# Patient Record
Sex: Female | Born: 1975
Health system: Southern US, Community
[De-identification: ages and names within clinical notes are randomized; demographics above are authoritative.]

## PROBLEM LIST (undated history)

## (undated) DIAGNOSIS — F32A Depression, unspecified: Secondary | ICD-10-CM

## (undated) DIAGNOSIS — E785 Hyperlipidemia, unspecified: Secondary | ICD-10-CM

## (undated) DIAGNOSIS — F419 Anxiety disorder, unspecified: Secondary | ICD-10-CM

## (undated) HISTORY — DX: Depression, unspecified: F32.A

## (undated) HISTORY — DX: Anxiety disorder, unspecified: F41.9

## (undated) HISTORY — DX: Hyperlipidemia, unspecified: E78.5

## (undated) HISTORY — PX: OTHER SURGICAL HISTORY: SHX169

---

## 2012-07-31 ENCOUNTER — Encounter (HOSPITAL_COMMUNITY): Payer: Self-pay | Admitting: *Deleted

## 2012-07-31 ENCOUNTER — Emergency Department (HOSPITAL_COMMUNITY): Payer: Medicaid Other

## 2012-07-31 ENCOUNTER — Emergency Department (HOSPITAL_COMMUNITY)
Admission: EM | Admit: 2012-07-31 | Discharge: 2012-08-01 | Disposition: A | Discharge status: Home / Self Care | Payer: Medicaid Other | Attending: Emergency Medicine | Admitting: Emergency Medicine

## 2012-07-31 DIAGNOSIS — M549 Dorsalgia, unspecified: Secondary | ICD-10-CM | POA: Insufficient documentation

## 2012-07-31 DIAGNOSIS — R109 Unspecified abdominal pain: Secondary | ICD-10-CM | POA: Insufficient documentation

## 2012-07-31 DIAGNOSIS — Z3202 Encounter for pregnancy test, result negative: Secondary | ICD-10-CM | POA: Insufficient documentation

## 2012-07-31 DIAGNOSIS — R112 Nausea with vomiting, unspecified: Secondary | ICD-10-CM | POA: Insufficient documentation

## 2012-07-31 DIAGNOSIS — R300 Dysuria: Secondary | ICD-10-CM | POA: Insufficient documentation

## 2012-07-31 DIAGNOSIS — R51 Headache: Secondary | ICD-10-CM | POA: Insufficient documentation

## 2012-07-31 DIAGNOSIS — N898 Other specified noninflammatory disorders of vagina: Secondary | ICD-10-CM | POA: Insufficient documentation

## 2012-07-31 LAB — CBC WITH DIFFERENTIAL/PLATELET
Eosinophils Absolute: 0.1 10*3/uL (ref 0.0–0.7)
Eosinophils Relative: 2 % (ref 0–5)
HCT: 38.7 % (ref 36.0–46.0)
Hemoglobin: 13.3 g/dL (ref 12.0–15.0)
Lymphs Abs: 3 10*3/uL (ref 0.7–4.0)
MCH: 30.5 pg (ref 26.0–34.0)
MCV: 88.8 fL (ref 78.0–100.0)
Monocytes Relative: 6 % (ref 3–12)
RBC: 4.36 MIL/uL (ref 3.87–5.11)

## 2012-07-31 LAB — URINALYSIS, ROUTINE W REFLEX MICROSCOPIC
Bilirubin Urine: NEGATIVE
Glucose, UA: NEGATIVE mg/dL
Ketones, ur: NEGATIVE mg/dL
pH: 5 (ref 5.0–8.0)

## 2012-07-31 LAB — WET PREP, GENITAL
Clue Cells Wet Prep HPF POC: NONE SEEN
Trich, Wet Prep: NONE SEEN

## 2012-07-31 LAB — URINE MICROSCOPIC-ADD ON

## 2012-07-31 LAB — POCT I-STAT, CHEM 8
Calcium, Ion: 1.19 mmol/L (ref 1.12–1.23)
Chloride: 106 mEq/L (ref 96–112)
Creatinine, Ser: 0.8 mg/dL (ref 0.50–1.10)
Glucose, Bld: 93 mg/dL (ref 70–99)
HCT: 39 % (ref 36.0–46.0)
Hemoglobin: 13.3 g/dL (ref 12.0–15.0)

## 2012-07-31 NOTE — ED Provider Notes (Signed)
History     CSN: [id number]  Arrival date & time 07/31/12  2032   First MD Initiated Contact with Patient 07/31/12 2154      Chief Complaint  Patient presents with  . Abdominal Pain  . Headache  . Back Pain    (Consider location/radiation/quality/duration/timing/severity/associated sxs/prior treatment) HPI Comments: Patient with 3 weeks of suprapubic pain, report has 2 days of vaginal spotting that resolved than 5 days ago return of vagnal bleeding. She has an IUD in place for the past 8 years and has had occasional irregular vaginal bleeding but none associated with pain.  She also report intermittent mid back pain with out dysuria, diarrhea or constipation.  Denies vaginal discharge has 1 sexual partner   Patient is a 37 y.o. female presenting with abdominal pain, headaches, and back pain. The history is provided by the patient.  Abdominal Pain Pain location:  Suprapubic Pain quality: stabbing   Pain radiates to:  Does not radiate Pain severity:  Moderate Onset quality:  Gradual Duration:  3 weeks Timing:  Constant Progression:  Waxing and waning Chronicity:  New Relieved by:  Nothing Ineffective treatments:  NSAIDs Associated symptoms: vaginal bleeding   Associated symptoms: no chills, no constipation, no diarrhea, no dysuria, no fever, no nausea, no vaginal discharge and no vomiting   Headache Associated symptoms: abdominal pain and back pain   Associated symptoms: no diarrhea, no dizziness, no fever, no nausea and no vomiting   Back Pain Associated symptoms: abdominal pain and headaches   Associated symptoms: no dysuria and no fever     No past medical history on file.  Past Surgical History  Procedure Laterality Date  . Ceserean      History reviewed. No pertinent family history.  History  Substance Use Topics  . Smoking status: Never Smoker   . Smokeless tobacco: Not on file  . Alcohol Use: No    OB History   Grav Para Term Preterm Abortions TAB SAB  Ect Mult Living                  Review of Systems  Constitutional: Negative for fever and chills.  Eyes: Negative for visual disturbance.  Gastrointestinal: Positive for abdominal pain. Negative for nausea, vomiting, diarrhea, constipation, blood in stool and abdominal distention.  Genitourinary: Positive for flank pain and vaginal bleeding. Negative for dysuria, frequency, decreased urine volume, vaginal discharge, vaginal pain and dyspareunia.  Musculoskeletal: Positive for back pain.  Skin: Negative for rash.  Neurological: Positive for headaches. Negative for dizziness.  All other systems reviewed and are negative.    Allergies  Review of patient's allergies indicates no known allergies.  Home Medications   Current Outpatient Rx  Name  Route  Sig  Dispense  Refill  . HYDROcodone-acetaminophen (NORCO/VICODIN) 5-325 MG per tablet   Oral   Take 1 tablet by mouth every 6 (six) hours as needed for pain.   10 tablet   0     BP 121/68  Pulse 74  Temp(Src) 98.7 F (37.1 C) (Oral)  Resp 20  SpO2 100%  LMP 07/27/2012  Physical Exam  Constitutional: She is oriented to person, place, and time. She appears well-developed and well-nourished.  HENT:  Head: Normocephalic.  Eyes: Pupils are equal, round, and reactive to light.  Neck: Normal range of motion.  Cardiovascular: Normal rate and regular rhythm.   Pulmonary/Chest: Effort normal and breath sounds normal.  Abdominal: Soft. Bowel sounds are normal. She exhibits no distension. There is  no hepatosplenomegaly. There is tenderness in the suprapubic area.  Genitourinary: Uterus normal. Uterus is not tender. Left adnexum displays tenderness. There is bleeding around the vagina. No tenderness around the vagina. No vaginal discharge found.  Musculoskeletal: Normal range of motion.  Lymphadenopathy:       Right: No inguinal adenopathy present.       Left: No inguinal adenopathy present.  Neurological: She is alert and oriented  to person, place, and time.  Skin: Skin is warm.    ED Course  Procedures (including critical care time)  Labs Reviewed  WET PREP, GENITAL - Abnormal; Notable for the following:    WBC, Wet Prep HPF POC FEW (*)    All other components within normal limits  URINALYSIS, ROUTINE W REFLEX MICROSCOPIC - Abnormal; Notable for the following:    APPearance CLOUDY (*)    Hgb urine dipstick LARGE (*)    Leukocytes, UA SMALL (*)    All other components within normal limits  GC/CHLAMYDIA PROBE AMP  PREGNANCY, URINE  CBC WITH DIFFERENTIAL  URINE MICROSCOPIC-ADD ON  POCT I-STAT, CHEM 8   US Transvaginal Non-ob  08/01/2012  *RADIOLOGY REPORT*  Clinical Data:  Abdominal pain.  Left lower quadrant pain. Evaluate for torsion.  TRANSABDOMINAL AND TRANSVAGINAL ULTRASOUND OF PELVIS DOPPLER ULTRASOUND OF OVARIES  Technique:  Both transabdominal and transvaginal ultrasound examinations of the pelvis were performed. Transabdominal technique was performed for global imaging of the pelvis including uterus, ovaries, adnexal regions, and pelvic cul-de-sac.  It was necessary to proceed with endovaginal exam following the transabdominal exam to visualize the endometrium and adnexal regions.  Color and duplex Doppler ultrasound was utilized to evaluate blood flow to the ovaries.  Comparison:  None  Findings:  Uterus:  7.6 x 4.2 x 5.2 cm.  Normal appearance.  Endometrium:  5.2 mm.  Intrauterine device is identified, in the expected location.  Right ovary: 2.8 x 1.5 x 1.6 cm.  Small follicles are present.  Left ovary:   3.0 x 2.0 x 2.4 cm.  Follicles are present.  Pulsed Doppler evaluation demonstrates normal arterial and venous flow.  Additional findings:  There is a small amount free pelvic fluid.  IMPRESSION:  1.  No evidence for adnexal mass. 2.  No evidence for ovarian torsion. 3.  Well-positioned intrauterine device.   Original Report Authenticated By: Norva Pavlov, M.D.    US Pelvis Complete  08/01/2012   *RADIOLOGY REPORT*  Clinical Data:  Abdominal pain.  Left lower quadrant pain. Evaluate for torsion.  TRANSABDOMINAL AND TRANSVAGINAL ULTRASOUND OF PELVIS DOPPLER ULTRASOUND OF OVARIES  Technique:  Both transabdominal and transvaginal ultrasound examinations of the pelvis were performed. Transabdominal technique was performed for global imaging of the pelvis including uterus, ovaries, adnexal regions, and pelvic cul-de-sac.  It was necessary to proceed with endovaginal exam following the transabdominal exam to visualize the endometrium and adnexal regions.  Color and duplex Doppler ultrasound was utilized to evaluate blood flow to the ovaries.  Comparison:  None  Findings:  Uterus:  7.6 x 4.2 x 5.2 cm.  Normal appearance.  Endometrium:  5.2 mm.  Intrauterine device is identified, in the expected location.  Right ovary: 2.8 x 1.5 x 1.6 cm.  Small follicles are present.  Left ovary:   3.0 x 2.0 x 2.4 cm.  Follicles are present.  Pulsed Doppler evaluation demonstrates normal arterial and venous flow.  Additional findings:  There is a small amount free pelvic fluid.  IMPRESSION:  1.  No evidence for  adnexal mass. 2.  No evidence for ovarian torsion. 3.  Well-positioned intrauterine device.   Original Report Authenticated By: Norva Pavlov, M.D.    [place] Art/ven Flow Abd Pelv Doppler  08/01/2012  *RADIOLOGY REPORT*  Clinical Data:  Abdominal pain.  Left lower quadrant pain. Evaluate for torsion.  TRANSABDOMINAL AND TRANSVAGINAL ULTRASOUND OF PELVIS DOPPLER ULTRASOUND OF OVARIES  Technique:  Both transabdominal and transvaginal ultrasound examinations of the pelvis were performed. Transabdominal technique was performed for global imaging of the pelvis including uterus, ovaries, adnexal regions, and pelvic cul-de-sac.  It was necessary to proceed with endovaginal exam following the transabdominal exam to visualize the endometrium and adnexal regions.  Color and duplex Doppler ultrasound was utilized to evaluate blood  flow to the ovaries.  Comparison:  None  Findings:  Uterus:  7.6 x 4.2 x 5.2 cm.  Normal appearance.  Endometrium:  5.2 mm.  Intrauterine device is identified, in the expected location.  Right ovary: 2.8 x 1.5 x 1.6 cm.  Small follicles are present.  Left ovary:   3.0 x 2.0 x 2.4 cm.  Follicles are present.  Pulsed Doppler evaluation demonstrates normal arterial and venous flow.  Additional findings:  There is a small amount free pelvic fluid.  IMPRESSION:  1.  No evidence for adnexal mass. 2.  No evidence for ovarian torsion. 3.  Well-positioned intrauterine device.   Original Report Authenticated By: Norva Pavlov, M.D.      1. Abdominal pain       MDM   We'll discharge home with prescription for pain control, and followup with her OB/GYN as needed        Arman Filter, NP 08/01/12 7829  Arman Filter, NP 08/01/12 5621  Arman Filter, NP 08/01/12 0222  Arman Filter, NP 08/01/12 0225

## 2012-07-31 NOTE — ED Notes (Signed)
Pt present with c/o mid abd pain and tenderness x 3 weeks.  Pt reports increased pain.  Pt has IUD placed.  Pt deneis n/v, chills or fever.  Pt denies abnormal discharge

## 2012-08-01 MED ORDER — HYDROCODONE-ACETAMINOPHEN 5-325 MG PO TABS: 1.0000 | ORAL_TABLET | Freq: Four times a day (QID) | ORAL | Status: DC | PRN

## 2012-08-01 NOTE — ED Provider Notes (Signed)
History     CSN: [id number]  Arrival date & time 07/31/12  2032   First MD Initiated Contact with Patient 07/31/12 2154      Chief Complaint  Patient presents with  . Abdominal Pain  . Headache  . Back Pain    (Consider location/radiation/quality/duration/timing/severity/associated sxs/prior treatment) HPI Comments: Nausea, abdominal pain headache  Patient is a 37 y.o. female presenting with abdominal pain, headaches, and back pain. The history is provided by the patient.  Abdominal Pain Pain location:  Suprapubic, RLQ and LLQ Pain quality: aching   Pain radiates to:  Back Pain severity:  Mild Onset quality:  Gradual Timing:  Constant Chronicity:  New Relieved by:  Nothing Worsened by:  Nothing tried Ineffective treatments:  None tried Associated symptoms: dysuria, nausea, vaginal bleeding and vomiting   Associated symptoms: no vaginal discharge   Headache Associated symptoms: abdominal pain, back pain, nausea and vomiting   Back Pain Associated symptoms: abdominal pain, dysuria and headaches     No past medical history on file.  Past Surgical History  Procedure Laterality Date  . Ceserean      History reviewed. No pertinent family history.  History  Substance Use Topics  . Smoking status: Never Smoker   . Smokeless tobacco: Not on file  . Alcohol Use: No    OB History   Grav Para Term Preterm Abortions TAB SAB Ect Mult Living                  Review of Systems  Gastrointestinal: Positive for nausea, vomiting and abdominal pain.  Genitourinary: Positive for dysuria and vaginal bleeding. Negative for vaginal discharge and vaginal pain.  Musculoskeletal: Positive for back pain.  Neurological: Positive for headaches.  All other systems reviewed and are negative.    Allergies  Review of patient's allergies indicates no known allergies.  Home Medications   Current Outpatient Rx  Name  Route  Sig  Dispense  Refill  . HYDROcodone-acetaminophen  (NORCO/VICODIN) 5-325 MG per tablet   Oral   Take 1 tablet by mouth every 6 (six) hours as needed for pain.   10 tablet   0     BP 117/78  Pulse 62  Temp(Src) 98.2 F (36.8 C) (Oral)  Resp 14  SpO2 100%  LMP 07/27/2012  Physical Exam  Nursing note and vitals reviewed. Constitutional: She appears well-developed and well-nourished.  HENT:  Head: Normocephalic.  Cardiovascular: Normal rate.   Abdominal: Soft. Bowel sounds are normal. She exhibits no distension.  Genitourinary: Vagina normal. Right adnexum displays no mass and no tenderness. Left adnexum displays no mass and no tenderness. No tenderness around the vagina. No vaginal discharge found.  Musculoskeletal: Normal range of motion.  Lymphadenopathy:       Right: No inguinal adenopathy present.  Neurological: She is alert.  Skin: Skin is warm.    ED Course  Procedures (including critical care time)  Labs Reviewed  WET PREP, GENITAL - Abnormal; Notable for the following:    WBC, Wet Prep HPF POC FEW (*)    All other components within normal limits  URINALYSIS, ROUTINE W REFLEX MICROSCOPIC - Abnormal; Notable for the following:    APPearance CLOUDY (*)    Hgb urine dipstick LARGE (*)    Leukocytes, UA SMALL (*)    All other components within normal limits  GC/CHLAMYDIA PROBE AMP  PREGNANCY, URINE  CBC WITH DIFFERENTIAL  URINE MICROSCOPIC-ADD ON  POCT I-STAT, CHEM 8   No results found.  1. Abdominal pain       MDM  Labs, UA and vaginal ultrasound normal will DC home with Hydrocodone Patient to return if not improving         Arman Filter, NP 08/03/12 2317  Arman Filter, NP 08/03/12 2317

## 2012-08-01 NOTE — ED Notes (Signed)
Pt verbalizes understanding 

## 2012-08-01 NOTE — ED Provider Notes (Signed)
Medical screening examination/treatment/procedure(s) were performed by non-physician practitioner and as supervising physician I was immediately available for consultation/collaboration.   David Yelverton, MD 08/01/12 2318 

## 2012-08-04 NOTE — ED Provider Notes (Signed)
Medical screening examination/treatment/procedure(s) were performed by non-physician practitioner and as supervising physician I was immediately available for consultation/collaboration.   Lyanne Co, MD 08/04/12 [phone]

## 2013-03-30 ENCOUNTER — Other Ambulatory Visit (HOSPITAL_COMMUNITY)
Admission: RE | Admit: 2013-03-30 | Discharge: 2013-03-30 | Disposition: A | Discharge status: Home / Self Care | Payer: 59 | Source: Ambulatory Visit | Attending: Obstetrics & Gynecology | Admitting: Obstetrics & Gynecology

## 2013-03-30 ENCOUNTER — Other Ambulatory Visit: Payer: Self-pay | Admitting: Obstetrics & Gynecology

## 2013-03-30 DIAGNOSIS — N760 Acute vaginitis: Secondary | ICD-10-CM | POA: Insufficient documentation

## 2013-03-30 DIAGNOSIS — Z01419 Encounter for gynecological examination (general) (routine) without abnormal findings: Secondary | ICD-10-CM | POA: Insufficient documentation

## 2013-03-30 DIAGNOSIS — Z1151 Encounter for screening for human papillomavirus (HPV): Secondary | ICD-10-CM | POA: Insufficient documentation

## 2015-09-07 ENCOUNTER — Encounter: Payer: Self-pay | Admitting: Family Medicine

## 2015-09-07 ENCOUNTER — Ambulatory Visit (INDEPENDENT_AMBULATORY_CARE_PROVIDER_SITE_OTHER): Payer: 59 | Admitting: Family Medicine

## 2015-09-07 VITALS — BP 130/80 | HR 84 | Temp 99.1°F | Resp 12 | Ht 65.75 in | Wt 147.1 lb

## 2015-09-07 DIAGNOSIS — R5382 Chronic fatigue, unspecified: Secondary | ICD-10-CM

## 2015-09-07 DIAGNOSIS — B351 Tinea unguium: Secondary | ICD-10-CM

## 2015-09-07 DIAGNOSIS — F331 Major depressive disorder, recurrent, moderate: Secondary | ICD-10-CM

## 2015-09-07 LAB — CBC
HCT: 43.8 % (ref 36.0–46.0)
Hemoglobin: 15 g/dL (ref 12.0–15.0)
MCHC: 34.2 g/dL (ref 30.0–36.0)
MCV: 91.3 fl (ref 78.0–100.0)
PLATELETS: 341 10*3/uL (ref 150.0–400.0)
RBC: 4.79 Mil/uL (ref 3.87–5.11)
RDW: 12.5 % (ref 11.5–15.5)
WBC: 10.2 10*3/uL (ref 4.0–10.5)

## 2015-09-07 LAB — TSH: TSH: 0.9 u[IU]/mL (ref 0.35–4.50)

## 2015-09-07 MED ORDER — EFINACONAZOLE 10 % EX SOLN: 1.0000 "application " | Freq: Every day | CUTANEOUS | Status: AC

## 2015-09-07 MED ORDER — SERTRALINE HCL 50 MG PO TABS
50.0000 mg | ORAL_TABLET | Freq: Every day | ORAL | Status: DC
Start: 2015-09-07 — End: 2015-12-29

## 2015-09-07 NOTE — Progress Notes (Addendum)
HPI:  Rebekah [name] is here to establish care with me. Former PCP, Dr Tally Joeavid Swayne. She follows with gynecologists annually for her female prevention care.  Hx of allergic rhinitis, well controlled with OTC Claritin 10 mg daily. She lives with husband and 2 children.  Last physical a few years ago.   Has the following chronic problems that require follow up and concerns today:  Today she is c/o fatigue, which she has had for many years. According to patient, she had labs done about 3 years ago and was instructed to follow every 2-3 years. She states that at take this daily worse in the morning. No associated fever, chills, arthralgias, myalgias, changes in bowel movements, skin rash, abnormal wt loss. She is not aware of any sleep apnea. Sleeps well, about 7-8 hours. She follows a healthy diet and exercises regularly.  Depression: She is reporting history of depression since she was in high school, history of suicidal attempt when she was 40 years old. Until about 1-2 years ago she was still having some suicidal thoughts, denies having a plan. She denies any hospitalization due to psychiatry illness. She has not tried any pharmacologic treatment for depression. Family history positive, father history of depression. She is not aware of any history of bipolar disease in her family. She states that sometimes she feels like eating "a lot" and sometimes she doesn't feel like eating, she doesn't have any problem  Sleeping.  Currently she is on oral contraceptive pills, started about 2 years ago, initially she felt like medication was aggravating her depression symptoms but started feeling better (back to her baseline) after a few months of taking.  "Nail fungal" infection: She is also complaining of fungal infection on all her goals for many years. She has tried several oral antimycotic medications, with partial improvement but worse again after a few months of finishing the treatment. She  denies any pain, periungual edema/erythema, or drainage.   REVIEW OF SYSTEMS:  General: Negative for fever, abnormal weight loss, changes in appetite.  + Fatigue.  Eyes: Negative for conjunctival erythema, visual changes.  ENT: Negative for earache, hearing loss, or ear drainage. + Intermittent rhinorrhea, nasal congestion. No sinus pain or epistaxis. Negative for oral lesions, odynophagia, dysphagia.  Neck: negative for swollen glands or masses.  Cardiac: Negative for chest pain, exertional dyspnea, irregular HR, claudication, cold extremities, or edema.  Respiratory: Negative for cough, dyspnea, wheezing, or hemoptysis.  Abdomen: Negative for abdominal pain, changes in bowel habits, blood in stool or melena, nausea, or vomiting.  GU: Negative for dysuria, urinary frequency, urinary ungency, gross hematuria, urine incontinence.   MS: No arthralgias, joint erythema or edema, joint stiffness, or limitation of ROM.  Neurologic: No confusion,  focal weakness, numbness or tingling, frequent/severe headaches, or tremor.  Psychiatric: No anxiety, suicidal ideation, or sleep disorder.+ Depression.  Skin: Negative for rash, ulcers, or skin color changes.  No past medical history on file.  Past Surgical History  Procedure Laterality Date  . Ceserean      Family History  Problem Relation Age of Onset  . Hypertension Mother   . Diabetes Father   . Hyperlipidemia Father   . Cancer Father     Stomach?  . Heart disease Father     Social History   Social History  . Marital Status: Married    Spouse Name: N/A  . Number of Children: N/A  . Years of Education: N/A   Social History Main Topics  .  Smoking status: Never Smoker   . Smokeless tobacco: Former Neurosurgeon    Types: Chew  . Alcohol Use: 0.0 oz/week    0 Standard drinks or equivalent per week  . Drug Use: No  . Sexual Activity:    Partners: Male   Other Topics Concern  . None   Social History Narrative      Current outpatient prescriptions:  .  levonorgestrel-ethinyl estradiol (AVIANE,ALESSE,LESSINA) 0.1-20 MG-MCG tablet, Take by mouth daily., Disp: , Rfl:  .  loratadine (CLARITIN) 10 MG tablet, Take 10 mg by mouth as needed for allergies., Disp: , Rfl:     EXAM:  Filed Vitals:   09/07/15 1417  BP: 130/80  Pulse: 84  Temp: 99.1 F (37.3 C)  Resp: 12    Body mass index is 23.92 kg/(m^2).  GENERAL: vitals reviewed and listed above, Well developed and nourished, appears well hydrated and in no acute distress.  ENT: atraumatic, conjuntiva clear, no obvious abnormalities on inspection of external nose and ears. PERRL and EOMI bilateral.  NECK: no obvious masses on inspection. No thyromegaly or cervical adenophathy appreciated.  LUNGS: clear to auscultation bilaterally, no wheezes, rales or rhonchi, good air movement  CV: Regular rate and rhythm, no murmurs appreciated, no peripheral edema  ABDOMEN: Soft, no tender, no masses or hepatomegaly appreciated.  MS: moves all extremities without noticeable abnormality, no signs of synovitis.  NEURO: Alert and oriented x 3, CN II-XII grossly intact, no focal deficit appreciated, normal gait.  PSYCH: pleasant and cooperative, no obvious depression or anxiety, no suicidal. Well groomed, good eye contact.  SKIN: Nail polish on toenails, so I cannot evaluate for nail changes. Under a couple toenail I can see white debris. No periungual edema, erythema,or tenderness.   Depression screen PHQ 2/9 09/07/2015  Decreased Interest 2  Down, Depressed, Hopeless 1  PHQ - 2 Score 3  Altered sleeping 2  Tired, decreased energy 3  Change in appetite 2  Feeling bad or failure about yourself  0  Trouble concentrating 1  Moving slowly or fidgety/restless 1  Suicidal thoughts 0  PHQ-9 Score 12  Difficult doing work/chores Somewhat difficult    ASSESSMENT AND PLAN:  Discussed the following assessment and plan:  Chronic fatigue  We  discussed possible causes, in her case I am  thinking depression is the main problem. Recommended continuing healthy diet and regular exercise. Today some labs done but seems like she has had work-up done before for same problem.Further recommendations will be given according to results.  Explained that oral contraceptive pills could aggravate problem, recommended discussing other contraception options with her gynecologist, vasectomy is a good option if husband is willing to go through procedure.  - Plan: TSH, COMPLETE METABOLIC PANEL WITH GFR, CBC  Moderate episode of recurrent major depressive disorder (HCC)  Future treatment options were discussed, she agrees to start sertraline. Some side effects discussed, she was instructed to start with half tablet 5 days and increase it to 1 tablet if it is well tolerated. Clearly instructed about warning signs. I will see her back in 3-4 weeks, before if needed.  - Plan: sertraline (ZOLOFT) 50 MG tablet  Onychomycosis of toenail  Next time she needs to remove nail polish before visit. Explained that this is a chronic problem and difficult to treat, some side effects of oral antimycotic medications were discussed. She agrees to try topical medication, explained that I'm not sure about insurance coverage. She can also try soaking feet in the vineger with  water, also keep toenails trimmed.  - Plan: Efinaconazole (JUBLIA) 10 % SOLN    -We reviewed the PMH, PSH, FH, SH, Meds and Allergies. -We addressed current concerns per orders and patient instructions. -We have asked for records for pertinent exams, studies, vaccines and notes from previous providers.    -Patient advised to return or notify a doctor immediately if symptoms worsen or persist or new concerns arise.      Betty G. [place], MD  [facility]. [place] office.

## 2015-09-07 NOTE — Patient Instructions (Addendum)
It is a common symptom associated with multiple factors: psychologic,medications, systemic illness, sleep disorders,infections, and unknown causes. Some work-up can be done to evaluate for common causes as thyroid disease,anemia,diabetes, or abnormalities in calcium,potassium,or sodium. Regular physical activity and a healthy diet is usually recommended for chronic fatigue.  Sertraline recommended for depression, start with 1/2 tab and increase to 1 tab in 5-7 days if well tolerated. If any thought about hurting your self or somebody else stop medication. If strong thoughts or ideation please go to ER.

## 2015-09-08 ENCOUNTER — Encounter: Payer: Self-pay | Admitting: Family Medicine

## 2015-09-08 LAB — COMPLETE METABOLIC PANEL WITH GFR
ALT: 14 U/L (ref 6–29)
AST: 15 U/L (ref 10–30)
Albumin: 4.3 g/dL (ref 3.6–5.1)
Alkaline Phosphatase: 48 U/L (ref 33–115)
BILIRUBIN TOTAL: 0.2 mg/dL (ref 0.2–1.2)
BUN: 14 mg/dL (ref 7–25)
CALCIUM: 9.9 mg/dL (ref 8.6–10.2)
CHLORIDE: 104 mmol/L (ref 98–110)
CO2: 25 mmol/L (ref 20–31)
CREATININE: 0.72 mg/dL (ref 0.50–1.10)
Glucose, Bld: 87 mg/dL (ref 65–99)
Potassium: 4.4 mmol/L (ref 3.5–5.3)
Sodium: 139 mmol/L (ref 135–146)
TOTAL PROTEIN: 7.3 g/dL (ref 6.1–8.1)

## 2015-10-03 ENCOUNTER — Ambulatory Visit: Payer: 59 | Admitting: Family Medicine

## 2015-10-26 ENCOUNTER — Encounter: Payer: Self-pay | Admitting: Family Medicine

## 2015-12-29 ENCOUNTER — Ambulatory Visit (INDEPENDENT_AMBULATORY_CARE_PROVIDER_SITE_OTHER): Payer: 59 | Admitting: Family Medicine

## 2015-12-29 ENCOUNTER — Encounter: Payer: Self-pay | Admitting: Family Medicine

## 2015-12-29 VITALS — BP 118/78 | HR 69 | Ht 65.75 in | Wt 140.2 lb

## 2015-12-29 DIAGNOSIS — R51 Headache: Secondary | ICD-10-CM | POA: Diagnosis not present

## 2015-12-29 DIAGNOSIS — N926 Irregular menstruation, unspecified: Secondary | ICD-10-CM

## 2015-12-29 DIAGNOSIS — B351 Tinea unguium: Secondary | ICD-10-CM

## 2015-12-29 DIAGNOSIS — F331 Major depressive disorder, recurrent, moderate: Secondary | ICD-10-CM

## 2015-12-29 DIAGNOSIS — R519 Headache, unspecified: Secondary | ICD-10-CM

## 2015-12-29 LAB — POCT URINE PREGNANCY: Preg Test, Ur: NEGATIVE

## 2015-12-29 MED ORDER — EFINACONAZOLE 10 % EX SOLN: 1.0000 "application " | Freq: Every day | CUTANEOUS | 2 refills | Status: DC

## 2015-12-29 MED ORDER — SERTRALINE HCL 50 MG PO TABS: 50.0000 mg | ORAL_TABLET | Freq: Every day | ORAL | 1 refills | Status: DC

## 2015-12-29 NOTE — Patient Instructions (Addendum)
A few things to remember from today's visit:   Onychomycosis - Plan: Efinaconazole 10 % SOLN  Moderate episode of recurrent major depressive disorder (HCC) - Plan: sertraline (ZOLOFT) 50 MG tablet  Abnormal menstrual cycle - Plan: POCT urine pregnancy  Headache, unspecified headache type ? Tension headache vs withdrawal symptoms.   Resume Sertraline.  Toe nail fungal infection is hard to treat, high recurrence. Please be sure medication list is accurate. If a new problem present, please set up appointment sooner than planned today.

## 2015-12-29 NOTE — Progress Notes (Signed)
HPI:   [name] is a 40 y.o. female, who is here today with her husband to follow on depression and anxiety.  I saw [name] on 09/07/2015, when she established care with me and has some concerns about depression and fatigue. Hx of depression since she was in high school, history of suicidal attempt when she was 40 years old.   Family history positive, father history of depression.    She was started on Sertraline 50 mg daily, she was instructed to follow 3 weeks later, which she didn't. She ran out of medication about 3 weeks ago. She is reporting improvement of fatigue, depressed mood, and anxiety with Sertraline. She denies any side effect, she denies suicidal thoughts or insomnia.  Concerns today: headache and palpitations.   About 2 weeks ago she had an episode of lightheadedness, palpitation, feeling like she couldn't breathe. She took her BP and he was 94/75 and HR 57/m. Symptoms resolved after drinking coffee with sugar. According to husband, around the same time she learned that her aunt had died at age 38 from a heart attack. She tells me that she was very closed to her aunt.  She'll be having global intermittent mild headaches, no associated nausea, vomiting, or photophobia. No prior history of headaches. She has been exercising regularly, she denies any chest pain, dyspnea, or diaphoresis while exercising. She is also eating healthier.  LMP about 5 weeks ago, lighter, she is on OCP's.  -Today she also would like for me to check her toenail, last office visit she was concerned about fungal infection right big toenail. She has tried other treatments in the past but usually problem reoccurs.    Review of Systems  Constitutional: Positive for fatigue. Negative for activity change, appetite change, fever and unexpected weight change.  HENT: Negative for facial swelling, mouth sores and trouble swallowing.   Eyes: Negative for photophobia, redness and  visual disturbance.  Respiratory: Negative for cough, shortness of breath and wheezing.   Cardiovascular: Positive for palpitations. Negative for chest pain and leg swelling.  Gastrointestinal: Negative for abdominal pain, nausea and vomiting.       No changes in bowel habits.  Genitourinary: Negative for frequency, menstrual problem and urgency.       LMP over a month ago, lighter. On OCP  Skin: Negative for rash.  Neurological: Positive for headaches. Negative for dizziness, tremors, syncope, weakness and numbness.  Psychiatric/Behavioral: Negative for confusion, hallucinations and sleep disturbance. The patient is nervous/anxious.       Current Outpatient Prescriptions on File Prior to Visit  Medication Sig Dispense Refill  . levonorgestrel-ethinyl estradiol (AVIANE,ALESSE,LESSINA) 0.1-20 MG-MCG tablet Take by mouth daily.    Marland Kitchen. loratadine (CLARITIN) 10 MG tablet Take 10 mg by mouth as needed for allergies.     No current facility-administered medications on file prior to visit.      No past medical history on file. No Known Allergies  Social History   Social History  . Marital status: Married    Spouse name: N/A  . Number of children: N/A  . Years of education: N/A   Social History Main Topics  . Smoking status: Never Smoker  . Smokeless tobacco: Former NeurosurgeonUser    Types: Chew  . Alcohol use 0.0 oz/week  . Drug use: No  . Sexual activity: Yes    Partners: Male   Other Topics Concern  . None   Social History Narrative  . None  Vitals:   12/29/15 0849  BP: 118/78  Pulse: 69   Body mass index is 22.81 kg/m.   Wt Readings from Last 3 Encounters:  12/29/15 140 lb 4 oz (63.6 kg)  09/07/15 147 lb 1.6 oz (66.7 kg)      Physical Exam  Nursing note and vitals reviewed. Constitutional: She is oriented to person, place, and time. She appears well-developed and well-nourished. No distress.  HENT:  Head: Atraumatic.  Mouth/Throat: Oropharynx is clear and  moist and mucous membranes are normal.  Eyes: Conjunctivae and EOM are normal. Pupils are equal, round, and reactive to light.  Neck: No thyromegaly present.  Cardiovascular: Normal rate and regular rhythm.   No murmur heard. Respiratory: Effort normal and breath sounds normal. No respiratory distress.  Musculoskeletal: She exhibits no edema.       Cervical back: She exhibits normal range of motion and no tenderness.  Lymphadenopathy:    She has no cervical adenopathy.  Neurological: She is alert and oriented to person, place, and time. She has normal strength. No cranial nerve deficit. Gait normal.  Skin: Skin is warm. No rash noted. No erythema.  Right big toenail detached from nail bed, some whitish debris under toe nail, no periungual erythema or edema.  Psychiatric: Her speech is normal. Her mood appears anxious. She expresses no suicidal ideation. She expresses no suicidal plans.  Well groomed, good eye contact.      ASSESSMENT AND PLAN:     [name] was seen today for follow-up.  Diagnoses and all orders for this visit:  Onychomycosis  She has completed several oral treatments, we discussed side effects of antimycotic medication and high incidence of disease recurrence. She still would like to try something, so topical treatment recommended. Follow-up as needed.  -     Efinaconazole 10 % SOLN; Apply 1 application topically daily. For 48 weeks.  Moderate episode of recurrent major depressive disorder (HCC)  Reporting improvement with Sertraline 50 mg, so she will resume medication. We discussed some side effects. Instructed about warning signs. She can follow up in 6 months, before if needed.  -     sertraline (ZOLOFT) 50 MG tablet; Take 1 tablet (50 mg total) by mouth daily.  Abnormal menstrual cycle -     POCT urine pregnancy was negative today.  Headache, unspecified headache type  This could be tension-like headache versus withdrawal symptoms from stopping  sertraline abruptly. We will monitor for now. Instructed about warning signs. She will follow in 6 months, before if needed.      -Next office visit planning on a routine physical.    -[name] was advised to return sooner than planned today if new concerns arise.       Betty G. [place], MD  [facility]. [place] office.

## 2016-01-04 ENCOUNTER — Telehealth: Payer: Self-pay | Admitting: Family Medicine

## 2016-01-04 NOTE — Telephone Encounter (Signed)
Received PA request for Jublia 10% from [facility]. PA submitted & is pending. Key: Rebekah [id number]

## 2016-07-05 ENCOUNTER — Encounter: Payer: Self-pay | Admitting: Family Medicine

## 2016-07-05 ENCOUNTER — Ambulatory Visit (INDEPENDENT_AMBULATORY_CARE_PROVIDER_SITE_OTHER): Payer: 59 | Admitting: Family Medicine

## 2016-07-05 VITALS — BP 106/78 | HR 74 | Resp 12 | Ht 65.75 in | Wt 151.0 lb

## 2016-07-05 DIAGNOSIS — G4700 Insomnia, unspecified: Secondary | ICD-10-CM | POA: Diagnosis not present

## 2016-07-05 DIAGNOSIS — F39 Unspecified mood [affective] disorder: Secondary | ICD-10-CM

## 2016-07-05 DIAGNOSIS — R232 Flushing: Secondary | ICD-10-CM | POA: Diagnosis not present

## 2016-07-05 DIAGNOSIS — L709 Acne, unspecified: Secondary | ICD-10-CM

## 2016-07-05 DIAGNOSIS — F331 Major depressive disorder, recurrent, moderate: Secondary | ICD-10-CM | POA: Diagnosis not present

## 2016-07-05 MED ORDER — ARIPIPRAZOLE 2 MG PO TABS: 2.0000 mg | ORAL_TABLET | Freq: Every day | ORAL | 1 refills | Status: DC

## 2016-07-05 MED ORDER — ERYTHROMYCIN 2 % EX GEL: Freq: Two times a day (BID) | CUTANEOUS | 2 refills | Status: DC

## 2016-07-05 NOTE — Progress Notes (Signed)
Pre visit review using our clinic review tool, if applicable. No additional management support is needed unless otherwise documented below in the visit note. 

## 2016-07-05 NOTE — Progress Notes (Signed)
HPI:   [name] is a [age] y.o. female, who is here today to follow on some chronic medical problems.  Hx of chronic fatigue, depression,and anxiety.  Depression and anxiety:  09/07/15 Sertraline was started. Last f/u visit 12/29/15, when symptoms reported as well controlled.  Today she is reporting worsening depression for the past few weeks. She has not identified exacerbating factors. She denies any stressor situation at home.  She states that some days she is happy and full of energy, she cleans her house and very active. Other times she is very depressed,does not want to get up from bed,crying, and hopeless. Mood changes last about 2-3 days, sudden changes without apparent cause. She denies suicidal thoughts or plan but tells me that sometimes she wishes "everything ends." She denies any thought of plan of hurting others. She denies Hx of bipolar disorder.  + Insomnia for about 1-2 months. Mainly having difficulty falling asleep.   Eating "bad", usually one meal per day, dinner usually. Drinking "a lot of coffee."   Acne: Today she is also c/o increased in skin lesions on upper chest and back, seems worse since she discontinue OCP's.Lesions heal spontaneously , those that she can reach take longer to heal, upper chest and neck.  She denies fever,chills,or painful lesions. No new skin products or detergents.  OCP's was causing worsening depression.   Hot flashes: For a couple months now having intermittent hot flashes and sweating. Her sister started menopause in her late 4830's early 6940's.  She also mentions that since she discontinued OCP's she has lighter menses, still regular, lasts 2-3 days.  Husband had vasectomy.  She has not identified trigger or relieving factors. She has not tried OTC medications.    Review of Systems  Constitutional: Positive for appetite change and fatigue. Negative for chills and fever.  HENT: Negative for mouth sores,  sore throat and trouble swallowing.   Eyes: Negative for redness and visual disturbance.  Respiratory: Negative for cough, shortness of breath and wheezing.   Cardiovascular: Negative for chest pain, palpitations and leg swelling.  Gastrointestinal: Negative for abdominal pain, nausea and vomiting.       No changes in bowel habits.  Endocrine: Negative for cold intolerance.  Genitourinary: Negative for decreased urine volume, dyspareunia, dysuria, vaginal bleeding and vaginal discharge.  Musculoskeletal: Negative for arthralgias, gait problem and myalgias.  Skin: Positive for rash.  Neurological: Negative for syncope, weakness and headaches.  Psychiatric/Behavioral: Positive for dysphoric mood and sleep disturbance. Negative for confusion and suicidal ideas. The patient is nervous/anxious.       Current Outpatient Prescriptions on File Prior to Visit  Medication Sig Dispense Refill  . Efinaconazole 10 % SOLN Apply 1 application topically daily. For 48 weeks. 8 mL 2  . loratadine (CLARITIN) 10 MG tablet Take 10 mg by mouth as needed for allergies.    Marland Kitchen. sertraline (ZOLOFT) 50 MG tablet Take 1 tablet (50 mg total) by mouth daily. 90 tablet 1   No current facility-administered medications on file prior to visit.      No past medical history on file. No Known Allergies  Social History   Social History  . Marital status: Married    Spouse name: N/A  . Number of children: N/A  . Years of education: N/A   Social History Main Topics  . Smoking status: Never Smoker  . Smokeless tobacco: Former NeurosurgeonUser    Types: Chew  . Alcohol use 0.0 oz/week  .  Drug use: No  . Sexual activity: Yes    Partners: Male   Other Topics Concern  . None   Social History Narrative  . None    Vitals:   07/05/16 0858  BP: 106/78  Pulse: 74  Resp: 12   Body mass index is 24.56 kg/m.   Physical Exam  Nursing note and vitals reviewed. Constitutional: She is oriented to person, place, and time.  She appears well-developed and well-nourished. No distress.  HENT:  Mouth/Throat: Oropharynx is clear and moist and mucous membranes are normal.  Eyes: Conjunctivae and EOM are normal. Pupils are equal, round, and reactive to light.  Cardiovascular: Normal rate and regular rhythm.   No murmur heard. Respiratory: Effort normal and breath sounds normal. No respiratory distress.  GI: Soft. She exhibits no mass. There is no tenderness.  Musculoskeletal: She exhibits no edema or tenderness.  Lymphadenopathy:    She has no cervical adenopathy.  Neurological: She is alert and oriented to person, place, and time. She has normal strength. Coordination and gait normal.  Skin: Skin is warm. Rash noted. No petechiae noted. Rash is pustular. No erythema.     A few pustular lesions scattered on upper chest. On upper and lower back hyperpigmentation changes, no active lesions.   Psychiatric: Her speech is normal. Her mood appears anxious. Her affect is labile. Cognition and memory are normal. She expresses no suicidal ideation. She expresses no suicidal plans.  Well groomed, good eye contact.      ASSESSMENT AND PLAN:     Rebekah [name] was seen today for follow-up.  Diagnoses and all orders for this visit:  Insomnia, unspecified type  Most likely related to mood disorder but also could be aggravated by caffeine intake. Good sleep hygiene discussed and recommended for now.  Hot flashes  We discussed possible causes, seems perimenopausal like symptoms.  Lab Results  Component Value Date   TSH 0.90 09/07/2015  Some OTC available treatments discussed,she could try Back Cohost.   Moderate episode of recurrent major depressive disorder (HCC)  No changes in Zoloft. Abilify 2 mg added. Instructed about warning signs. F/U in 3-4 weeks.  -     ARIPiprazole (ABILIFY) 2 MG tablet; Take 1 tablet (2 mg total) by mouth daily.  Mood disorder (HCC)  ? Bipolar disorder. Abilify added today. No  changes in Zoloft. F/U in 3-4 months.   -     ARIPiprazole (ABILIFY) 2 MG tablet; Take 1 tablet (2 mg total) by mouth daily.  Acne, unspecified acne type  She will try topical abx treatment. Instructed to avoid manipulating skin lesions. [place] Link screen may help with preventing post inflammatory pigmentation changes. May consider oral Doxycycline if not better with topical treatment.   -     erythromycin with ethanol (EMGEL) 2 % gel; Apply topically 2 (two) times daily.     -[name] was advised to return sooner than planned today if new concerns arise.       Betty G. [place], MD  [facility]. [place] office.

## 2016-07-05 NOTE — Patient Instructions (Signed)
A few things to remember from today's visit:   Hot flashes  Moderate episode of recurrent major depressive disorder (HCC) - Plan: ARIPiprazole (ABILIFY) 2 MG tablet  Mood disorder (HCC) - Plan: ARIPiprazole (ABILIFY) 2 MG tablet  Insomnia, unspecified type  Acne, unspecified acne type - Plan: erythromycin with ethanol (EMGEL) 2 % gel  Today Abilify added to Zoloft.  Over the counter Back Cohost or Stroven may help with hot flashes.   Please be sure medication list is accurate. If a new problem present, please set up appointment sooner than planned today.

## 2016-07-15 ENCOUNTER — Other Ambulatory Visit: Payer: Self-pay | Admitting: Family Medicine

## 2016-07-15 DIAGNOSIS — F331 Major depressive disorder, recurrent, moderate: Secondary | ICD-10-CM

## 2016-07-15 DIAGNOSIS — F39 Unspecified mood [affective] disorder: Secondary | ICD-10-CM

## 2016-07-15 MED ORDER — QUETIAPINE FUMARATE 200 MG PO TABS: 200.0000 mg | ORAL_TABLET | Freq: Every day | ORAL | 1 refills | Status: DC

## 2016-07-24 ENCOUNTER — Other Ambulatory Visit: Payer: Self-pay | Admitting: Family Medicine

## 2016-07-24 DIAGNOSIS — F331 Major depressive disorder, recurrent, moderate: Secondary | ICD-10-CM

## 2016-07-31 ENCOUNTER — Ambulatory Visit: Payer: 59 | Admitting: Family Medicine

## 2016-12-26 ENCOUNTER — Other Ambulatory Visit: Payer: Self-pay | Admitting: Family Medicine

## 2016-12-26 DIAGNOSIS — F331 Major depressive disorder, recurrent, moderate: Secondary | ICD-10-CM

## 2017-02-06 ENCOUNTER — Encounter: Payer: Self-pay | Admitting: Family Medicine

## 2017-09-30 NOTE — Progress Notes (Signed)
HPI:   [name] is a 42 y.o. female, who is here today for her routine physical.  Last CPE: 11/2014.   Regular exercise 3 or more time per week: 30 min daily of cardio. Following a healthy diet: Not consistently but she is trying.  She lives with her husband and 2 children.  Pap smear about 3 years ago. Negative for abnormal Pap smear. No Hx of STD.   There is no immunization history on file for this patient.   Concerns today:  Depression and anxiety:  She is on Sertraline 50 mg, which she feels like it is helping with depression. No suicidal thoughts. + Crying spells.  C/O panic attacks for the past few months 2 episode: Fear,tremor, and diaphoresis. Anxiety seems to be worsening.  Lab Results  Component Value Date   TSH 0.90 09/07/2015     Review of Systems  Constitutional: Negative for appetite change, fatigue, fever and unexpected weight change.  HENT: Negative for dental problem, hearing loss, nosebleeds, trouble swallowing and voice change.   Eyes: Negative for redness and visual disturbance.  Respiratory: Negative for cough, shortness of breath and wheezing.   Cardiovascular: Negative for chest pain and leg swelling.  Gastrointestinal: Negative for abdominal pain, blood in stool, nausea and vomiting.       No changes in bowel habits.  Endocrine: Negative for cold intolerance, heat intolerance, polydipsia, polyphagia and polyuria.  Genitourinary: Negative for decreased urine volume, dysuria, hematuria, menstrual problem, vaginal bleeding and vaginal discharge.       No breast tenderness or nipple discharge.  Musculoskeletal: Negative for gait problem and myalgias.  Skin: Negative for pallor and rash.  Allergic/Immunologic: Positive for environmental allergies.  Neurological: Negative for syncope, weakness, numbness and headaches.  Hematological: Negative for adenopathy. Does not bruise/bleed easily.  Psychiatric/Behavioral: Negative for  confusion, sleep disturbance and suicidal ideas. The patient is nervous/anxious.   All other systems reviewed and are negative.     Current Outpatient Medications on File Prior to Visit  Medication Sig Dispense Refill  . loratadine (CLARITIN) 10 MG tablet Take 10 mg by mouth as needed for allergies.     No current facility-administered medications on file prior to visit.      History reviewed. No pertinent past medical history.  Past Surgical History:  Procedure Laterality Date  . ceserean      No Known Allergies  Family History  Problem Relation Age of Onset  . Hypertension Mother   . Diabetes Father   . Hyperlipidemia Father   . Cancer Father        Stomach?  . Heart disease Father     Social History   Socioeconomic History  . Marital status: Married    Spouse name: Not on file  . Number of children: Not on file  . Years of education: Not on file  . Highest education level: Not on file  Occupational History  . Not on file  Social Needs  . Financial resource strain: Not on file  . Food insecurity:    Worry: Not on file    Inability: Not on file  . Transportation needs:    Medical: Not on file    Non-medical: Not on file  Tobacco Use  . Smoking status: Never Smoker  . Smokeless tobacco: Former Neurosurgeon    Types: Chew  Substance and Sexual Activity  . Alcohol use: Yes    Alcohol/week: 0.0 oz  . Drug use: No  .  Sexual activity: Yes    Partners: Male  Lifestyle  . Physical activity:    Days per week: Not on file    Minutes per session: Not on file  . Stress: Not on file  Relationships  . Social connections:    Talks on phone: Not on file    Gets together: Not on file    Attends religious service: Not on file    Active member of club or organization: Not on file    Attends meetings of clubs or organizations: Not on file    Relationship status: Not on file  Other Topics Concern  . Not on file  Social History Narrative  . Not on file     Vitals:     10/01/17 0840  BP: 123/82  Pulse: 88  Resp: 12  Temp: 97.7 F (36.5 C)  SpO2: 96%   Body mass index is 26.55 kg/m.   Wt Readings from Last 3 Encounters:  10/01/17 163 lb 4 oz (74 kg)  07/05/16 151 lb (68.5 kg)  12/29/15 140 lb 4 oz (63.6 kg)    Physical Exam  Nursing note and vitals reviewed. Constitutional: She is oriented to person, place, and time. She appears well-developed and well-nourished. No distress.  HENT:  Head: Normocephalic and atraumatic.  Right Ear: Hearing, tympanic membrane, external ear and ear canal normal.  Left Ear: Hearing, tympanic membrane, external ear and ear canal normal.  Mouth/Throat: Uvula is midline, oropharynx is clear and moist and mucous membranes are normal.  Eyes: Pupils are equal, round, and reactive to light. Conjunctivae and EOM are normal.  Neck: No tracheal deviation present. No thyroid mass and no thyromegaly present.  Cardiovascular: Normal rate and regular rhythm.  No murmur heard. Pulses:      Dorsalis pedis pulses are 2+ on the right side, and 2+ on the left side.  Respiratory: Effort normal and breath sounds normal. No respiratory distress.  GI: Soft. She exhibits no mass. There is no hepatomegaly. There is no tenderness.  Genitourinary: There is no rash, tenderness or lesion on the right labia. There is no rash, tenderness or lesion on the left labia. Uterus is not enlarged and not tender. Cervix exhibits friability. Cervix exhibits no motion tenderness and no discharge. Right adnexum displays no mass, no tenderness and no fullness. Left adnexum displays fullness. Left adnexum displays no mass and no tenderness. No erythema, tenderness or bleeding in the vagina. No vaginal discharge found.  Genitourinary Comments: Breast: Fibrocystic-like changes, mainly in upper quadrants, bilateral. No nipple discharge or skin abnormalities.  Pap smear collected.  Musculoskeletal: She exhibits no edema.  No major deformity or signs of  synovitis appreciated.  Lymphadenopathy:    She has no cervical adenopathy.       Right: No inguinal and no supraclavicular adenopathy present.       Left: No inguinal and no supraclavicular adenopathy present.  Neurological: She is alert and oriented to person, place, and time. She has normal strength. No cranial nerve deficit. Coordination and gait normal.  Reflex Scores:      Bicep reflexes are 2+ on the right side and 2+ on the left side.      Patellar reflexes are 2+ on the right side and 2+ on the left side. Skin: Skin is warm. No rash noted. No erythema.  Psychiatric: Her mood appears anxious. Cognition and memory are normal. She expresses no suicidal ideation.  Well groomed, good eye contact.      ASSESSMENT AND  PLAN:  [name] was here today annual physical examination.   Orders Placed This Encounter  Procedures  . US PELVIC COMPLETE WITH TRANSVAGINAL  . Basic metabolic panel  . Lipid panel  . TSH   Lab Results  Component Value Date   TSH 1.14 10/01/2017   Lab Results  Component Value Date   CHOL 203 (H) 10/01/2017   HDL 46.90 10/01/2017   LDLDIRECT 144.0 10/01/2017   TRIG 210.0 (H) 10/01/2017   CHOLHDL 4 10/01/2017   Lab Results  Component Value Date   CREATININE 0.70 10/01/2017   BUN 15 10/01/2017   NA 138 10/01/2017   K 4.3 10/01/2017   CL 103 10/01/2017   CO2 28 10/01/2017     Routine general medical examination at a health care facility  We discussed the importance of regular physical activity and healthy diet for prevention of chronic illness and/or complications. Preventive guidelines reviewed. Vaccination up to date.  Next CPE in a year.  Screening for lipid disorders -     Lipid panel  Diabetes mellitus screening -     Basic metabolic panel  Cervical cancer screening -     Cytology - PAP ([place])  Moderate episode of recurrent major depressive disorder (HCC)  Improved. Sertraline dose increased because worsening  anxiety. F/U in 3 months,before if needed.  -     TSH -     sertraline (ZOLOFT) 100 MG tablet; Take 1 tablet (100 mg total) by mouth daily.  Panic disorder without agoraphobia  Sertraline dose increased from 50 mg to 100 mg daily. F/U in 3 months,before if needed.  -     TSH -     sertraline (ZOLOFT) 100 MG tablet; Take 1 tablet (100 mg total) by mouth daily.  Pelvic fullness in female  Noted today on pelvic examination. Further recommendations will be given according to maging results.   -     US PELVIC COMPLETE WITH TRANSVAGINAL; Future     Return in about 3 months (around 01/01/2018) for anxiety.          Rian Busche G. [place], MD  [facility]. [place] office.

## 2017-10-01 ENCOUNTER — Encounter: Payer: Self-pay | Admitting: Family Medicine

## 2017-10-01 ENCOUNTER — Other Ambulatory Visit (HOSPITAL_COMMUNITY)
Admission: RE | Admit: 2017-10-01 | Discharge: 2017-10-01 | Disposition: A | Discharge status: Home / Self Care | Payer: 59 | Source: Ambulatory Visit | Attending: Family Medicine | Admitting: Family Medicine

## 2017-10-01 ENCOUNTER — Ambulatory Visit (INDEPENDENT_AMBULATORY_CARE_PROVIDER_SITE_OTHER): Payer: 59 | Admitting: Family Medicine

## 2017-10-01 VITALS — BP 123/82 | HR 88 | Temp 97.7°F | Resp 12 | Ht 65.75 in | Wt 163.2 lb

## 2017-10-01 DIAGNOSIS — Z131 Encounter for screening for diabetes mellitus: Secondary | ICD-10-CM | POA: Diagnosis not present

## 2017-10-01 DIAGNOSIS — R1900 Intra-abdominal and pelvic swelling, mass and lump, unspecified site: Secondary | ICD-10-CM | POA: Diagnosis not present

## 2017-10-01 DIAGNOSIS — Z124 Encounter for screening for malignant neoplasm of cervix: Secondary | ICD-10-CM | POA: Insufficient documentation

## 2017-10-01 DIAGNOSIS — Z13220 Encounter for screening for lipoid disorders: Secondary | ICD-10-CM | POA: Diagnosis not present

## 2017-10-01 DIAGNOSIS — Z0000 Encounter for general adult medical examination without abnormal findings: Secondary | ICD-10-CM

## 2017-10-01 DIAGNOSIS — F331 Major depressive disorder, recurrent, moderate: Secondary | ICD-10-CM

## 2017-10-01 DIAGNOSIS — F410 Panic disorder [episodic paroxysmal anxiety] without agoraphobia: Secondary | ICD-10-CM

## 2017-10-01 LAB — LIPID PANEL
CHOL/HDL RATIO: 4
Cholesterol: 203 mg/dL — ABNORMAL HIGH (ref 0–200)
HDL: 46.9 mg/dL (ref >39.00)
NONHDL: 155.82
Triglycerides: 210 mg/dL — ABNORMAL HIGH (ref 0.0–149.0)
VLDL: 42 mg/dL — AB (ref 0.0–40.0)

## 2017-10-01 LAB — LDL CHOLESTEROL, DIRECT: LDL DIRECT: 144 mg/dL

## 2017-10-01 LAB — TSH: TSH: 1.14 u[IU]/mL (ref 0.35–4.50)

## 2017-10-01 LAB — BASIC METABOLIC PANEL
BUN: 15 mg/dL (ref 6–23)
CHLORIDE: 103 meq/L (ref 96–112)
CO2: 28 meq/L (ref 19–32)
Calcium: 9.3 mg/dL (ref 8.4–10.5)
Creatinine, Ser: 0.7 mg/dL (ref 0.40–1.20)
GFR: 98.75 mL/min (ref >60.00)
Glucose, Bld: 82 mg/dL (ref 70–99)
POTASSIUM: 4.3 meq/L (ref 3.5–5.1)
Sodium: 138 mEq/L (ref 135–145)

## 2017-10-01 MED ORDER — SERTRALINE HCL 100 MG PO TABS: 100.0000 mg | ORAL_TABLET | Freq: Every day | ORAL | 3 refills | Status: DC

## 2017-10-01 NOTE — Patient Instructions (Addendum)
A few things to remember from today's visit:   Routine general medical examination at a health care facility  Screening for lipid disorders - Plan: Lipid panel  Diabetes mellitus screening - Plan: Basic metabolic panel  Cervical cancer screening - Plan: Cytology - PAP ([place])  Moderate episode of recurrent major depressive disorder (HCC) - Plan: TSH, sertraline (ZOLOFT) 100 MG tablet  Panic disorder without agoraphobia - Plan: TSH, sertraline (ZOLOFT) 100 MG tablet  Pelvic fullness in female - Plan: US PELVIC COMPLETE WITH TRANSVAGINAL Today you have you routine preventive visit.  At least 150 minutes of moderate exercise per week, daily brisk walking for 15-30 min is a good exercise option. Healthy diet low in saturated (animal) fats and sweets and consisting of fresh fruits and vegetables, lean meats such as fish and white chicken and whole grains.  These are some of recommendations for screening depending of age and risk factors:   - Vaccines:  Tdap vaccine every 10 years.  Shingles vaccine recommended at age 97, could be given after 40 years of age but not sure about insurance coverage.   Pneumonia vaccines:  Prevnar 13 at 65 and Pneumovax at 66. Sometimes Pneumovax is giving earlier if history of smoking, lung disease,diabetes,kidney disease among some.    Screening for diabetes at age 41 and every 3 years.  Cervical cancer prevention:  Pap smear starts at 40 years of age and continues periodically until 40 years old in low risk women. Pap smear every 3 years between 58 and 37 years old. Pap smear every 3-5 years between women 30 and older if pap smear negative and HPV screening negative.   -Breast cancer: Mammogram: There is disagreement between experts about when to start screening in low risk asymptomatic female but recent recommendations are to start screening at 39 and not later than 40 years old , every 1-2 years and after 40 yo q 2 years. Screening is  recommended until 40 years old but some women can continue screening depending of healthy issues.   Colon cancer screening: starts at 40 years old until 40 years old.  Cholesterol disorder screening at age 57 and every 3 years.  Also recommended:  1. Dental visit- Brush and floss your teeth twice daily; visit your dentist twice a year. 2. Eye doctor- Get an eye exam at least every 2 years. 3. Helmet use- Always wear a helmet when riding a bicycle, motorcycle, rollerblading or skateboarding. 4. Safe sex- If you may be exposed to sexually transmitted infections, use a condom. 5. Seat belts- Seat belts can save your live; always wear one. 6. Smoke/Carbon Monoxide detectors- These detectors need to be installed on the appropriate level of your home. Replace batteries at least once a year. 7. Skin cancer- When out in the sun please cover up and use sunscreen 15 SPF or higher. 8. Violence- If anyone is threatening or hurting you, please tell your healthcare provider.  9. Drink alcohol in moderation- Limit alcohol intake to one drink or less per day. Never drink and drive.   Please be sure medication list is accurate. If a new problem present, please set up appointment sooner than planned today.

## 2017-10-02 ENCOUNTER — Encounter: Payer: Self-pay | Admitting: Family Medicine

## 2017-10-02 LAB — CYTOLOGY - PAP
ADEQUACY: ABSENT
Diagnosis: NEGATIVE
HPV: NOT DETECTED

## 2017-10-03 ENCOUNTER — Encounter: Payer: Self-pay | Admitting: Family Medicine

## 2017-11-04 ENCOUNTER — Ambulatory Visit
Admission: RE | Admit: 2017-11-04 | Discharge: 2017-11-04 | Disposition: A | Discharge status: Home / Self Care | Payer: 59 | Source: Ambulatory Visit | Attending: Family Medicine | Admitting: Family Medicine

## 2017-11-04 DIAGNOSIS — R1900 Intra-abdominal and pelvic swelling, mass and lump, unspecified site: Secondary | ICD-10-CM

## 2017-11-04 DIAGNOSIS — R1904 Left lower quadrant abdominal swelling, mass and lump: Secondary | ICD-10-CM | POA: Diagnosis not present

## 2017-11-05 ENCOUNTER — Encounter: Payer: Self-pay | Admitting: Family Medicine

## 2018-01-01 ENCOUNTER — Encounter: Payer: Self-pay | Admitting: Family Medicine

## 2018-01-01 DIAGNOSIS — Z8249 Family history of ischemic heart disease and other diseases of the circulatory system: Secondary | ICD-10-CM

## 2018-01-30 ENCOUNTER — Encounter: Payer: Self-pay | Admitting: Family Medicine

## 2018-06-19 ENCOUNTER — Encounter: Payer: Self-pay | Admitting: Interventional Cardiology

## 2018-06-19 ENCOUNTER — Ambulatory Visit (INDEPENDENT_AMBULATORY_CARE_PROVIDER_SITE_OTHER): Payer: 59 | Admitting: Interventional Cardiology

## 2018-06-19 VITALS — BP 120/78 | HR 83 | Ht 65.75 in | Wt 163.8 lb

## 2018-06-19 DIAGNOSIS — R5383 Other fatigue: Secondary | ICD-10-CM

## 2018-06-19 DIAGNOSIS — R0602 Shortness of breath: Secondary | ICD-10-CM

## 2018-06-19 DIAGNOSIS — R002 Palpitations: Secondary | ICD-10-CM

## 2018-06-19 NOTE — Progress Notes (Signed)
Cardiology Office Note   Date:  06/19/2018   ID:  Rebekah RochesterMarissa N Colombe, DOB 04/20/1976, MRN [id number]  PCP:  [place], Betty G, MD    No chief complaint on file.  DOE  [employer] Readings from Last 3 Encounters:  06/19/18 163 lb 12.8 oz (74.3 kg)  10/01/17 163 lb 4 oz (74 kg)  07/05/16 151 lb (68.5 kg)       History of Present Illness: Rebekah [name] is a 43 y.o. female who is being seen today for the evaluation of DOE at the request of [place], Timoteo ExposeBetty G, MD.  Several family members have had heart problems.  Her father had an enlarged heart.  A paternal aunt had sudden cardiac death.    She has had DOE for several months.  It is getting worse.  She has stopped exercising.    She stays at home for work and is feeling very tired.  Denies : Dizziness. Leg edema. Nitroglycerin use. Orthopnea. Paroxysmal nocturnal dyspnea. Shortness of breath. Syncope.   HR speeds up with minimal activity, and sometimes when lying down at night.    She is getting more anxiety as well.      No past medical history on file.  Past Surgical History:  Procedure Laterality Date  . ceserean       No current outpatient medications on file.   No current facility-administered medications for this visit.     Allergies:   Patient has no known allergies.    Social History:  The patient  reports that she has never smoked. She has quit using smokeless tobacco.  Her smokeless tobacco use included chew. She reports current alcohol use. She reports that she does not use drugs.   Family History:  The patient's family history includes Cancer in her father; Diabetes in her father; Heart disease in her father; Hyperlipidemia in her father; Hypertension in her mother.    ROS:  Please see the history of present illness.   Otherwise, review of systems are positive for DOE.   All other systems are reviewed and negative.    PHYSICAL EXAM: VS:  BP 120/78   Pulse 83   Ht 5' 5.75" (1.67 m)   Wt 163 lb 12.8 oz  (74.3 kg)   SpO2 99%   BMI 26.64 kg/m  , BMI Body mass index is 26.64 kg/m. GEN: Well nourished, well developed, in no acute distress  HEENT: normal  Neck: no JVD, carotid bruits, or masses Cardiac: RRR; no murmurs, rubs, or gallops,no edema  Respiratory:  clear to auscultation bilaterally, normal work of breathing GI: soft, nontender, nondistended, + BS MS: no deformity or atrophy  Skin: warm and dry, no rash Neuro:  Strength and sensation are intact Psych: euthymic mood, full affect   EKG:   The ekg ordered today demonstrates NSR, no ST changes   Recent Labs: 10/01/2017: BUN 15; Creatinine, Ser 0.70; Potassium 4.3; Sodium 138; TSH 1.14   Lipid Panel    Component Value Date/Time   CHOL 203 (H) 10/01/2017 0950   TRIG 210.0 (H) 10/01/2017 0950   HDL 46.90 10/01/2017 0950   CHOLHDL 4 10/01/2017 0950   VLDL 42.0 (H) 10/01/2017 0950   LDLDIRECT 144.0 10/01/2017 0950     Other studies Reviewed: Additional studies/ records that were reviewed today with results demonstrating: labs revewed, LDL 144 in 5/9.   ASSESSMENT AND PLAN:  1. DOE: Check echo to further evaluate for cardiac cause.  Family history sounds like perhaps  to have congestive heart failure issues.  No documented coronary disease per her report. 2. If EF is abnormal, she would need left and right heart cath.  If EF is normal, would consider coronary CTA.  Family is still concerned about some type of blockage, although I think the likelihood that she would have premature coronary artery disease is quite low. 3. Palpitations: Consider event monitor as well based on EF.  Some of her symptoms may be anxiety related, but certainly if her EF is low, would have to check for significant arrhythmia.   Current medicines are reviewed at length with the patient today.  The patient concerns regarding her medicines were addressed.  The following changes have been made:  No change  Labs/ tests ordered today include:  No  orders of the defined types were placed in this encounter.   Recommend 150 minutes/week of aerobic exercise Low fat, low carb, high fiber diet recommended  Disposition:   FU based on test result   Signed, Lance Muss, MD  06/19/2018 2:47 PM    [facility] [address] [place], [place], [place]  [address] Phone: [phone]; Fax: [phone]

## 2018-06-19 NOTE — Patient Instructions (Addendum)
Medication Instructions:  Your physician recommends that you continue on your current medications as directed. Please refer to the Current Medication list given to you today.  If you need a refill on your cardiac medications before your next appointment, please call your pharmacy.   Lab work: None Ordered  If you have labs (blood work) drawn today and your tests are completely normal, you will receive your results only by: . MyChart Message (if you have MyChart) OR . A paper copy in the mail If you have any lab test that is abnormal or we need to change your treatment, we will call you to review the results.  Testing/Procedures: Your physician has requested that you have an echocardiogram. Echocardiography is a painless test that uses sound waves to create images of your heart. It provides your doctor with information about the size and shape of your heart and how well your heart's chambers and valves are working. This procedure takes approximately one hour. There are no restrictions for this procedure.  Follow-Up: . Based on test results  Any Other Special Instructions Will Be Listed Below (If Applicable).   Echocardiogram An echocardiogram is a procedure that uses painless sound waves (ultrasound) to produce an image of the heart. Images from an echocardiogram can provide important information about:  Signs of coronary artery disease (CAD).  Aneurysm detection. An aneurysm is a weak or damaged part of an artery wall that bulges out from the normal force of blood pumping through the body.  Heart size and shape. Changes in the size or shape of the heart can be associated with certain conditions, including heart failure, aneurysm, and CAD.  Heart muscle function.  Heart valve function.  Signs of a past heart attack.  Fluid buildup around the heart.  Thickening of the heart muscle.  A tumor or infectious growth around the heart valves. Tell a health care provider about:  Any  allergies you have.  All medicines you are taking, including vitamins, herbs, eye drops, creams, and over-the-counter medicines.  Any blood disorders you have.  Any surgeries you have had.  Any medical conditions you have.  Whether you are pregnant or may be pregnant. What are the risks? Generally, this is a safe procedure. However, problems may occur, including:  Allergic reaction to dye (contrast) that may be used during the procedure. What happens before the procedure? No specific preparation is needed. You may eat and drink normally. What happens during the procedure?   An IV tube may be inserted into one of your veins.  You may receive contrast through this tube. A contrast is an injection that improves the quality of the pictures from your heart.  A gel will be applied to your chest.  A wand-like tool (transducer) will be moved over your chest. The gel will help to transmit the sound waves from the transducer.  The sound waves will harmlessly bounce off of your heart to allow the heart images to be captured in real-time motion. The images will be recorded on a computer. The procedure may vary among health care providers and hospitals. What happens after the procedure?  You may return to your normal, everyday life, including diet, activities, and medicines, unless your health care provider tells you not to do that. Summary  An echocardiogram is a procedure that uses painless sound waves (ultrasound) to produce an image of the heart.  Images from an echocardiogram can provide important information about the size and shape of your heart, heart muscle   function, heart valve function, and fluid buildup around your heart.  You do not need to do anything to prepare before this procedure. You may eat and drink normally.  After the echocardiogram is completed, you may return to your normal, everyday life, unless your health care provider tells you not to do that. This  information is not intended to replace advice given to you by your health care provider. Make sure you discuss any questions you have with your health care provider. Document Released: 05/03/2000 Document Revised: 06/08/2016 Document Reviewed: 06/08/2016 Elsevier Interactive Patient Education  2019 [employer]vier [employer]    

## 2018-06-26 ENCOUNTER — Other Ambulatory Visit (HOSPITAL_COMMUNITY): Payer: 59

## 2018-07-03 ENCOUNTER — Ambulatory Visit (HOSPITAL_COMMUNITY): Payer: 59 | Attending: Cardiology

## 2018-07-03 DIAGNOSIS — R0602 Shortness of breath: Secondary | ICD-10-CM | POA: Insufficient documentation

## 2018-07-07 ENCOUNTER — Telehealth: Payer: Self-pay | Admitting: Interventional Cardiology

## 2018-07-07 DIAGNOSIS — R0602 Shortness of breath: Secondary | ICD-10-CM

## 2018-07-07 DIAGNOSIS — Z01812 Encounter for preprocedural laboratory examination: Secondary | ICD-10-CM

## 2018-07-07 DIAGNOSIS — R072 Precordial pain: Secondary | ICD-10-CM

## 2018-07-07 MED ORDER — METOPROLOL TARTRATE 100 MG PO TABS: ORAL_TABLET | ORAL | 0 refills | Status: DC

## 2018-07-07 NOTE — Telephone Encounter (Signed)
Called and made patient aware of echo results. Patient states that she is still having chest pain and dyspnea on exertion. Made patient aware that Dr. Eldridge Dace would like to order Cardiac CT. Made patient aware that she will need BMET prior to CT. Reviewed instructions with the patient. Patient verbalized understanding. Patient aware that she will be contacted by [facility] to schedule Cardiac CT and BMET.   CARDIAC CT INSTRUCTIONS  Please arrive at the [facility] main entrance of [facility] on ____ at ____ AM (30-45 minutes prior to test start time)  [facility] [address] [place], [place] [address] [phone]  Proceed to the [facility] Radiology Department (First Floor).  Please follow these instructions carefully (unless otherwise directed):    On the Night Before the Test: . Be sure to Drink plenty of water. . Do not consume any caffeinated/decaffeinated beverages or chocolate 12 hours prior to your test. . Do not take any antihistamines 12 hours prior to your test.   On the Day of the Test: . Drink plenty of water. Do not drink any water within one hour of the test. . Do not eat any food 4 hours prior to the test. . You may take your regular medications prior to the test.  . Take metoprolol (Lopressor) two hours prior to test.     After the Test: . Drink plenty of water. . After receiving IV contrast, you may experience a mild flushed feeling. This is normal. . On occasion, you may experience a mild rash up to 24 hours after the test. This is not dangerous. If this occurs, you can take Benadryl 25 mg and increase your fluid intake. . If you experience trouble breathing, this can be serious. If it is severe call 911 IMMEDIATELY. If it is mild, please call our office.

## 2018-07-07 NOTE — Telephone Encounter (Signed)
Left message for patient to call back regarding her echocardiogram results. Made patient aware on message that I have additional questions for her.

## 2018-07-07 NOTE — Telephone Encounter (Signed)
-----   Message from Corky Crafts, MD sent at 07/03/2018  6:06 PM EST ----- Normal echo.  If she is still having chest pain, she can get coronary CT.  If CP gone, would offer screening test since they were concerned about blockages regardless.

## 2018-07-07 NOTE — Telephone Encounter (Signed)
Patient returned called for test results to ECHO.  She stated you can leave a detailed voice message.

## 2018-08-06 ENCOUNTER — Ambulatory Visit (HOSPITAL_COMMUNITY): Payer: 59

## 2018-11-18 ENCOUNTER — Ambulatory Visit (INDEPENDENT_AMBULATORY_CARE_PROVIDER_SITE_OTHER): Payer: 59 | Admitting: Psychology

## 2018-11-18 DIAGNOSIS — F331 Major depressive disorder, recurrent, moderate: Secondary | ICD-10-CM

## 2018-11-23 ENCOUNTER — Ambulatory Visit (INDEPENDENT_AMBULATORY_CARE_PROVIDER_SITE_OTHER): Payer: 59 | Admitting: Psychology

## 2018-11-23 DIAGNOSIS — F331 Major depressive disorder, recurrent, moderate: Secondary | ICD-10-CM

## 2018-12-03 ENCOUNTER — Ambulatory Visit: Payer: 59 | Admitting: Psychology

## 2019-09-21 ENCOUNTER — Encounter: Payer: 59 | Admitting: Family Medicine

## 2020-01-13 ENCOUNTER — Other Ambulatory Visit: Payer: Self-pay

## 2020-01-14 ENCOUNTER — Ambulatory Visit (INDEPENDENT_AMBULATORY_CARE_PROVIDER_SITE_OTHER): Payer: 59 | Admitting: Family Medicine

## 2020-01-14 ENCOUNTER — Encounter: Payer: Self-pay | Admitting: Family Medicine

## 2020-01-14 VITALS — BP 122/70 | HR 83 | Temp 98.7°F | Resp 12 | Ht 65.75 in | Wt 158.0 lb

## 2020-01-14 DIAGNOSIS — E785 Hyperlipidemia, unspecified: Secondary | ICD-10-CM

## 2020-01-14 DIAGNOSIS — Z130 Encounter for screening for diseases of the blood and blood-forming organs and certain disorders involving the immune mechanism: Secondary | ICD-10-CM

## 2020-01-14 DIAGNOSIS — F331 Major depressive disorder, recurrent, moderate: Secondary | ICD-10-CM | POA: Diagnosis not present

## 2020-01-14 DIAGNOSIS — Z1159 Encounter for screening for other viral diseases: Secondary | ICD-10-CM | POA: Diagnosis not present

## 2020-01-14 DIAGNOSIS — Z1231 Encounter for screening mammogram for malignant neoplasm of breast: Secondary | ICD-10-CM

## 2020-01-14 DIAGNOSIS — Z1329 Encounter for screening for other suspected endocrine disorder: Secondary | ICD-10-CM

## 2020-01-14 DIAGNOSIS — Z13228 Encounter for screening for other metabolic disorders: Secondary | ICD-10-CM

## 2020-01-14 DIAGNOSIS — Z0000 Encounter for general adult medical examination without abnormal findings: Secondary | ICD-10-CM | POA: Diagnosis not present

## 2020-01-14 NOTE — Assessment & Plan Note (Signed)
We have tried different medications in the past, did not help. Strongly recommend establishing with psychiatrist. List of providers in the area given. Suicidal line information given in AVS. Clearly instructed about warning signs.

## 2020-01-14 NOTE — Progress Notes (Signed)
HPI: Rebekah [name] is a 44 y.o. female, who is here today for her routine physical.  Last CPE: 10/01/17. No new problems since her last visit.  Regular exercise 3 or more time per week: Yes, she is doing different videos 45 min daily. Following a healthy diet: Yes She lives with her husband and 2 daughters (40 and 84 yo). Her mother in law is living with her temporally. She is having some health issues,including dementia.  Chronic medical problems: Severe depression and mood disorder.  Pap smear:09/2017 negative for malignancy and HPV was not detected. Hx of abnormal pap smears: Negative.  Immunization History  Administered Date(s) Administered  . PFIZER SARS-COV-2 Vaccination 08/26/2019, 09/20/2019   Mammogram: Never Colonoscopy: N/A DEXA: N/A  Hep C screening: Never  She has some concerns today.  She thinks she has hemorrhoids. Anal pruritus and occasional tenderness, noted 2 weeks ago. She denies constipation or diarrhea.  No changes in bowel movements or blood in stool. She used OTC prep H and it helped.  Depression and anxiety:  Last visit in 2019, I recommended establishing with psychiatrist.  She saw psychotherapy x 2 and pandemia started and could not see psychiatrist. In general problem is stable, intermittent episodes of depression. Crying for no reason and deeply sad. She saw a psychotherapist once before pandemia.  She has thought about being better off death, she has 2 daughters and does not want to leave them. She has tried Seroquel,Sertraline,Abilify.   Depression screen [facility] 2/9 01/14/2020 09/07/2015  Decreased Interest 1 2  Down, Depressed, Hopeless 2 1  PHQ - 2 Score 3 3  Altered sleeping 3 2  Tired, decreased energy 3 3  Change in appetite 3 2  Feeling bad or failure about yourself  2 0  Trouble concentrating 3 1  Moving slowly or fidgety/restless 3 1  Suicidal thoughts 3 0  PHQ-9 Score 23 12  Difficult doing work/chores Somewhat  difficult Somewhat difficult   GAD 7 : Generalized Anxiety Score 01/14/2020  Nervous, Anxious, on Edge 1  Control/stop worrying 1  Worry too much - different things 2  Trouble relaxing 3  Restless 3  Easily annoyed or irritable 3  Afraid - awful might happen 1  Total GAD 7 Score 14  Anxiety Difficulty Somewhat difficult    Review of Systems  Constitutional: Positive for fatigue. Negative for appetite change and fever.  HENT: Negative for dental problem, hearing loss, mouth sores, sore throat, trouble swallowing and voice change.   Eyes: Negative for redness and visual disturbance.  Respiratory: Negative for cough, shortness of breath and wheezing.   Cardiovascular: Negative for chest pain and leg swelling.  Gastrointestinal: Negative for abdominal pain, nausea and vomiting.       No changes in bowel habits.  Endocrine: Negative for cold intolerance, heat intolerance, polydipsia, polyphagia and polyuria.  Genitourinary: Negative for decreased urine volume, dysuria, hematuria, vaginal bleeding and vaginal discharge.  Musculoskeletal: Negative for gait problem and myalgias.  Skin: Negative for color change and rash.  Allergic/Immunologic: Negative for environmental allergies.  Neurological: Negative for syncope, weakness and headaches.  Hematological: Negative for adenopathy. Does not bruise/bleed easily.  Psychiatric/Behavioral: Positive for sleep disturbance. Negative for confusion and hallucinations. The patient is nervous/anxious.   All other systems reviewed and are negative.  No current outpatient medications on file prior to visit.   No current facility-administered medications on file prior to visit.   Past Medical History:  Diagnosis Date  . Anxiety   .  Depression   . Hyperlipidemia     Past Surgical History:  Procedure Laterality Date  . ceserean     No Known Allergies  Family History  Problem Relation Age of Onset  . Hypertension Mother   . Diabetes Father    . Hyperlipidemia Father   . Cancer Father        Stomach?  . Heart disease Father    Social History   Socioeconomic History  . Marital status: Married    Spouse name: Not on file  . Number of children: Not on file  . Years of education: Not on file  . Highest education level: Not on file  Occupational History  . Not on file  Tobacco Use  . Smoking status: Never Smoker  . Smokeless tobacco: Former Systems developer    Types: Chew  Substance and Sexual Activity  . Alcohol use: Yes    Alcohol/week: 0.0 standard drinks  . Drug use: No  . Sexual activity: Yes    Partners: Male  Other Topics Concern  . Not on file  Social History Narrative  . Not on file   Social Determinants of Health   Financial Resource Strain:   . Difficulty of Paying Living Expenses: Not on file  Food Insecurity:   . Worried About Charity fundraiser in the Last Year: Not on file  . Ran Out of Food in the Last Year: Not on file  Transportation Needs:   . Lack of Transportation (Medical): Not on file  . Lack of Transportation (Non-Medical): Not on file  Physical Activity:   . Days of Exercise per Week: Not on file  . Minutes of Exercise per Session: Not on file  Stress:   . Feeling of Stress : Not on file  Social Connections:   . Frequency of Communication with Friends and Family: Not on file  . Frequency of Social Gatherings with Friends and Family: Not on file  . Attends Religious Services: Not on file  . Active Member of Clubs or Organizations: Not on file  . Attends Archivist Meetings: Not on file  . Marital Status: Not on file   Vitals:   01/14/20 0714  BP: 122/70  Pulse: 83  Resp: 12  Temp: 98.7 F (37.1 C)  SpO2: 98%   Body mass index is 25.7 kg/m.  Wt Readings from Last 3 Encounters:  01/14/20 158 lb (71.7 kg)  06/19/18 163 lb 12.8 oz (74.3 kg)  10/01/17 163 lb 4 oz (74 kg)   Physical Exam Vitals and nursing note reviewed.  Constitutional:      General: She is not in  acute distress.    Appearance: She is well-developed.  HENT:     Head: Normocephalic and atraumatic.     Right Ear: Hearing, tympanic membrane, ear canal and external ear normal.     Left Ear: Hearing, tympanic membrane, ear canal and external ear normal.     Mouth/Throat:     Mouth: Mucous membranes are moist.     Pharynx: Oropharynx is clear. Uvula midline.  Eyes:     Extraocular Movements: Extraocular movements intact.     Conjunctiva/sclera: Conjunctivae normal.     Pupils: Pupils are equal, round, and reactive to light.  Neck:     Thyroid: No thyromegaly.     Trachea: No tracheal deviation.  Cardiovascular:     Rate and Rhythm: Normal rate and regular rhythm.     Pulses:  Dorsalis pedis pulses are 2+ on the right side and 2+ on the left side.     Heart sounds: No murmur heard.   Pulmonary:     Effort: Pulmonary effort is normal. No respiratory distress.     Breath sounds: Normal breath sounds.  Abdominal:     Palpations: Abdomen is soft. There is no hepatomegaly or mass.     Tenderness: There is no abdominal tenderness.  Genitourinary:    Comments: Breast: Fibrocystic changes, bilateral. No nipple discharge or skin changes. No masses. Musculoskeletal:     Comments: No major deformity or signs of synovitis appreciated.  Lymphadenopathy:     Cervical: No cervical adenopathy.     Upper Body:     Right upper body: No supraclavicular or axillary adenopathy.     Left upper body: No supraclavicular or axillary adenopathy.  Skin:    General: Skin is warm.     Findings: No erythema or rash.  Neurological:     Mental Status: She is alert and oriented to person, place, and time.     Cranial Nerves: No cranial nerve deficit.     Coordination: Coordination normal.     Gait: Gait normal.     Deep Tendon Reflexes:     Reflex Scores:      Bicep reflexes are 2+ on the right side and 2+ on the left side.      Patellar reflexes are 2+ on the right side and 2+ on the left  side. Psychiatric:        Mood and Affect: Mood is depressed.        Speech: Speech normal.        Behavior: Behavior is cooperative.        Thought Content: Thought content does not include homicidal or suicidal ideation. Thought content does not include homicidal or suicidal plan.        Cognition and Memory: Cognition normal.     Comments: Well groomed, good eye contact.    ASSESSMENT AND PLAN:  Ms. CAELI LINEHAN was here today annual physical examination.  Orders Placed This Encounter  Procedures  . Mammogram Digital Screening  . Comprehensive metabolic panel  . Lipid panel  . Hemoglobin A1c  . Hepatitis C antibody screen   Lab Results  Component Value Date   HGBA1C 5.3 01/14/2020   Lab Results  Component Value Date   CHOL 171 01/14/2020   HDL 47 (L) 01/14/2020   LDLCALC 103 (H) 01/14/2020   LDLDIRECT 144.0 10/01/2017   TRIG 111 01/14/2020   CHOLHDL 3.6 01/14/2020   Lab Results  Component Value Date   ALT 14 01/14/2020   AST 14 01/14/2020   ALKPHOS 48 09/07/2015   BILITOT 0.3 01/14/2020   Lab Results  Component Value Date   CREATININE 0.93 01/14/2020   BUN 9 01/14/2020   NA 139 01/14/2020   K 4.1 01/14/2020   CL 105 01/14/2020   CO2 24 01/14/2020    Routine general medical examination at a health care facility We discussed the importance of regular physical activity and healthy diet for prevention of chronic illness and/or complications. Preventive guidelines reviewed. Vaccination up to date. Pap smear due in 2024. Next CPE in a year.  The 10-year ASCVD risk score Mikey Bussing DC Brooke Bonito., et al., 2013) is: 0.6%   Values used to calculate the score:     Age: 60 years     Sex: Female     Is Non-Hispanic African American: No  Diabetic: No     Tobacco smoker: No     Systolic Blood Pressure: 563 mmHg     Is BP treated: No     HDL Cholesterol: 47 mg/dL     Total Cholesterol: 171 mg/dL  Encounter for HCV screening test for low risk patient -      Hepatitis C antibody screen  Visit for screening mammogram -     Mammogram Digital Screening; Future  Screening for endocrine, metabolic and immunity disorder -     Comprehensive metabolic panel; Future -     Hemoglobin A1c; Future -     Hemoglobin A1c -     Comprehensive metabolic panel   Moderate episode of recurrent major depressive disorder ([place]) We have tried different medications in the past, did not help. Strongly recommend establishing with psychiatrist. List of providers in the area given. Suicidal line information given in AVS. Clearly instructed about warning signs.   Hyperlipidemia Continue nonpharmacologic treatment. Further recommendation will be given according to lipid panel numbers as well as 10-year CVD risk score.   Return in 1 year (on 01/13/2021) for cpe.    G. Martin[place], MD  [facility]. [place] office.    A few things to remember from today's visit:  Routine general medical examination at a health care facility  Moderate episode of recurrent major depressive disorder ([place]), Chronic  Encounter for HCV screening test for low risk patient - Plan: Hepatitis C antibody screen  Hyperlipidemia, unspecified hyperlipidemia type - Plan: Lipid panel  Visit for screening mammogram - Plan: Mammogram Digital Screening  Screening for endocrine, metabolic and immunity disorder - Plan: Comprehensive metabolic panel, Hemoglobin A1c   Sentimientos suicidas: cmo ayudarse a s mismo Suicidal Feelings: How to Help Yourself El suicidio es cuando uno pone fin a su propia vida. Hay muchas cosas que puede hacer para ayudarse a s mismo a sentirse mejor cuando lucha contra estos sentimientos. Hay muchos servicios y personas disponibles para apoyarlos a usted y a Psychologist, sport and exercise sentimientos similares.  Si alguna vez siente que puede lastimarse o Market researcher a Producer, television/film/video, o tiene pensamientos de poner fin a su vida, busque ayuda de inmediato. Para  obtener ayuda:  Comunquese con el servicio de emergencias de su localidad (911 en los [place]).  La lnea de asistencia de los servicios de salud y servicios humanos de [place] [address] (211 en [place]).  Dirjase al servicio de urgencias ms cercano.  Llame a una lnea directa para la prevencin del suicidio y hable con un consejero capacitado. Las siguientes lneas directas para la prevencin del suicidio estn disponibles en [place]: ? 1-800-273-TALK [phone]). ? 1-800-SUICIDE (762)637-9899). ? [phone]. Esta es una lnea directa para hispanohablantes. ? [phone]. Esta es una lnea directa para usuarios de [place]. ? 1-866-4-U-TREVOR [phone]). Esta es una lnea directa para jvenes lesbianas, homosexuales, bisexuales, transexuales o que cuestionan su identidad sexual. ? Para obtener una lista de las lneas directas en [place], visite ParkingAffiliatePrograms.se.html  Comunquese con un centro de crisis o un centro de prevencin del suicidio local. Para encontrar un centro de crisis o un centro de prevencin del suicidio local: ? Llame al hospital, la clnica, la organizacin de servicios comunitarios, el centro de salud mental, el proveedor de servicios sociales o el departamento de salud locales. Solicite ayuda para conectarse con un centro de crisis. ? Para obtener [employer] de los centros de crisis en [place], visite: suicidepreventionlifeline.org ? Para obtener [employer]  centros de crisis en [place], visite: suicideprevention.ca [employer] a sentirse mejor   Promtase a s mismo que no tomar medidas drsticas cuando tenga sentimientos suicidas. Recuerde que hay esperanza. Muchas personas han superado pensamientos y sentimientos suicidas y usted [place]. Si ha tenido estos sentimientos antes, recurdese que puede superarlos nuevamente.  [place] a familiares, amigos, maestros o  consejeros cmo se siente. Trate de no separarse de aquellos que se preocupan por usted y desean ayudarlo. Hable con [place], [place] no sienta ganas de ser sociable. Las conversaciones en persona son mejores para ayudarlos a que entiendan sus sentimientos.  Comunquese con un profesional de la salud mental y trabaje con esta persona de modo regular.  Elabore un plan de seguridad que pueda seguir durante una crisis. Incluya nmeros de telfonos de las lneas directas para la prevencin del suicidio, de profesionales de la salud mental y de amigos y familiares de confianza a los que puede llamar durante Engineer, maintenance (IT). Guarde estos nmeros en su telfono.  Si est pensando en tomar muchos medicamentos, entrgueselos a alguien que pueda administrrselos segn lo indicado. Si toma antidepresivos y est preocupado porque podra tomar una sobredosis, informe a su mdico de modo que pueda indicarle medicamentos ms seguros.  Trate de ajustarse a sus rutinas. Programe sus das y Psychologist, forensic. Haga del cuidado propio una prioridad.  Elabore una lista de objetivos realistas y tchelos una vez que los haya logrado. Los logros pueden Engineer, water.  Espere hasta que se sienta mejor antes de hacer cosas que le resulten difciles o desagradables.  Haga cosas que siempre disfrut para distraerse de sus sentimientos. Intente leer un libro, o escuchar o tocar msica. Pasar tiempo al [employer], en la naturaleza, puede ayudar a que se sienta mejor. Siga estas instrucciones en su casa:   Visite a su mdico de cabecera todos los aos para realizarse una revisin mdica.  Trabaje con un profesional de la salud mental segn lo sea necesario.  Consuma una dieta bien equilibrada e ingiera comidas a intervalos regulares.  Descanse mucho.  Si puede, haga actividad fsica. Slo 30 minutos de ejercicio al da pueden ayudarlo a sentirse mejor.  Use los medicamentos de venta libre  y los recetados solamente como se lo haya indicado el mdico. Pregunte al profesional de salud mental sobre los posibles efectos secundarios de los medicamentos que est tomando.  No consuma alcohol ni drogas, y saque estas sustancias de su casa.  Saque armas, veneno, cuchillos y otros artculos mortales de su casa. Recomendaciones generales  Mantenga el lugar donde vive bien iluminado.  Cuando se sienta bien, escrbase una carta a usted mismo con sugerencias y palabras de apoyo que pueda leer cuando no se sienta bien.  Recuerde que las dificultades de la vida pueden superarse con [place]. Las afecciones pueden tratarse y usted puede aprender conductas y formas de pensar que lo ayudarn. Dnde buscar ms informacin  National Suicide Administrator ([place]): www.suicidepreventionlifeline.org  Hopeline ([place]): www.hopeline.com  Lowe's Companies for Suicide Prevention (Fundacin Estadounidense para la prevencin del suicidio): PromotionalLoans.co.za  The [employer] Corporation (El [place]) (para jvenes [place], homosexuales, bisexuales, transexuales o que cuestionan su identidad sexual): www.thetrevorproject.org Comunquese con un mdico si:  Siente que es una carga para los dems.  Se siente preocupado, enojado, vengativo o tiene cambios de humor extremos.  Se ha apartado de su familia y amigos. Solicite ayuda inmediatamente si:  Habla acerca del suicidio o  desea morir.  Comienza a hacer planes sobre cmo suicidarse.  Siente que no tiene motivos para vivir.  Comienza a hacer planes para poner en orden sus asuntos, despedirse o deshacerse de sus posesiones.  Siente culpa, vergenza o un dolor insoportable, y siente que no hay salida.  Est consumiendo alcohol y drogas con frecuencia.  Est teniendo conductas riesgosas que podran conducir a la [place]. Si tiene cualquiera de estos sntomas, [place] de inmediato. Llame a los  servicios de emergencias, dirjase al departamento de emergencias o centro de crisis ms cercano, o llame a las lneas de ayuda para crisis de suicidio. Resumen  El suicidio es cuando uno se quita su propia vida.  Promtase a s mismo que no tomar medidas drsticas cuando tenga sentimientos suicidas.  [place]unt[place] a familiares, amigos, maestros o consejeros cmo se siente.  Obtenga ayuda de inmediato si siente que es demasiado difcil lidiar con su vida y est pensando en el suicidio. Esta informacin no tiene Marine scientist el consejo del mdico. Asegrese de hacerle al mdico cualquier pregunta que tenga. Document Revised: 08/17/2018 Document Reviewed: 08/17/2018 Elsevier Patient Education  [place]  This is a list of options around [place], [place] that you can call and arrange an appointment.  -[facility] [phone] -[facility] [phone] - [facility] [phone] -[facility] [phone]  Dr Hardie Shackleton [phone] Dr Alexander Bergeron, MD [phone] Dr Chucky May, MD [phone])   Dr Allayne Butcher. Marcelino Freestone, MD [phone] Dr Geralyn Flash A. Lugo  Dr Sheralyn Boatman, MD [phone])  Dr Fredonia Highland, MD [phone]  Dr Luz Lex [phone]   Cuidados preventivos en las mujeres de 50 a 48 aos de edad Preventive Care 61-13 Years Old, Female Los cuidados preventivos hacen referencia a las opciones en cuanto a las visitas al mdico y al estilo de vida, las cuales pueden promover la salud y Musician. Esto puede comprender lo siguiente:  Un examen fsico anual. Esto tambin se puede llamar control de bienestar anual.  Visitas regulares al dentista y exmenes oculares.  Vacunas.  Estudios para Engineer, building services.  Opciones saludables de estilo de vida, como seguir una dieta  saludable, hacer ejercicio regularmente, no usar drogas ni productos que contengan nicotina y tabaco, y limitar el consumo de alcohol. Qu puedo esperar para mi visita de cuidado preventivo? Examen fsico El mdico revisar lo siguiente:  IT consultant y [place]. Esto se puede usar para calcular el ndice de masa corporal ([place]), que indica si tiene un peso saludable.  Frecuencia cardaca y presin arterial.  Piel para detectar manchas anormales. Asesoramiento Su mdico puede preguntarle acerca de:  Consumo de tabaco, alcohol y drogas.  Su bienestar emocional.  El bienestar en el hogar y sus relaciones personales.  Su actividad sexual.  Sus hbitos de alimentacin.  Su trabajo y [place] laboral.  Mtodos anticonceptivos.  Su ciclo menstrual.  Sus antecedentes de Media planner. Qu vacunas necesito?  [place] antigripal  Se recomienda aplicarse esta vacuna todos los [place]. Vacuna contra el ttanos, difteria y tos ferina (Tdap)  Es posible que tenga que aplicarse un refuerzo contra el ttanos y la difteria (DT) cada 10aos. Vacuna contra la varicela  Es posible que tenga que aplicrsela si no recibi esta vacuna. Vacuna contra el herpes zster (culebrilla)  Es posible  que la necesite despus de los 73 aos de edad. Vacuna contra el sarampin, rubola y paperas (SRP)  Es posible que necesite aplicarse al menos una dosis de la vacuna SRP si naci despus de [phone]. Podra tambin necesitar una segunda dosis. Vacuna antineumoccica conjugada (PCV13)  Puede necesitar esta vacuna si tiene determinadas enfermedades y no se vacun anteriormente. Edward [place]olly antineumoccica de polisacridos (PPSV23)  Quizs tenga que aplicarse una o dos dosis si fuma o si tiene determinadas afecciones. Edward Jolly antimeningoccica conjugada (MenACWY)  Puede necesitar esta vacuna si tiene determinadas afecciones. Vacuna contra la hepatitis A  Es posible que necesite esta vacuna si tiene ciertas afecciones o si  viaja o trabaja en lugares en los que podra estar expuesta a la hepatitis A. Vacuna contra la hepatitis B  Es posible que necesite esta vacuna si tiene ciertas afecciones o si viaja o trabaja en lugares en los que podra estar expuesta a la hepatitis B. Vacuna antihaemophilus influenzae tipo B (Hib)  Puede necesitar esta vacuna si tiene determinadas afecciones. Vacuna contra el virus del papiloma humano (VPH)  Si el mdico se lo recomienda, Research scientist (physical sciences) tres dosis a lo largo de 6 meses. Puede recibir las vacunas en forma de dosis individuales o en forma de dos o ms vacunas juntas en la misma inyeccin (vacunas combinadas). Hable con su mdico [employer] y beneficios de las vacunas combinadas. Qu pruebas necesito? Anlisis de [employer] de lpidos y colesterol. Estos se pueden verificar cada 5 aos o, con ms frecuencia, si usted tiene ms de 25 aos de edad.  Anlisis de hepatitisC.  Anlisis de hepatitisB. Pruebas de deteccin  Pruebas de deteccin de cncer de pulmn. Es posible que se le realice esta prueba de deteccin a partir de los 74 aos de edad, si ha fumado durante 30 aos un paquete diario y sigue fumando o dej el hbito en algn momento en los ltimos 15 aos.  Pruebas de Programme researcher, broadcasting/film/video de Surveyor, minerals. Todos los adultos a partir de los 62 aos de edad y [place] 68 aos de edad deben hacerse esta prueba de deteccin. El mdico puede recomendarle las pruebas de deteccin a partir de los 34 aos de edad si corre un mayor riesgo. Le realizarn pruebas cada 1 a 10 aos, segn los [place] y el tipo de prueba de Programme researcher, broadcasting/film/video.  Pruebas de deteccin de la diabetes. Esto se Set designer un control del azcar en la sangre (glucosa) despus de no haber comido durante un periodo de tiempo (ayuno). Es posible que se le realice esta prueba cada 1 a 3 aos.  Mamografa. Se puede realizar cada 1 o 2 aos. Hable con su mdico sobre cundo debe comenzar a Retail banker de [place] regular. Esto depende de si tiene antecedentes familiares de cncer de mama o no.  Pruebas de deteccin de cncer relacionado con las mutaciones del BRCA. Es posible que se las deba realizar si tiene antecedentes de cncer de mama, de ovario, de trompas o peritoneal.  Examen plvico y prueba de Papanicolaou. Esto se puede realizar cada 69aos a Renato Gails de los 21aos de edad. A partir de los 30 aos, esto se puede Optometrist cada 5 aos si usted se realiza una prueba de Papanicolaou en combinacin con una prueba de deteccin del virus del papiloma humano (VPH). Otras pruebas  Anlisis de enfermedades de transmisin sexual (ETS).  Densitometra sea. Esto se realiza para detectar osteoporosis. Se le puede realizar este examen de deteccin si tiene un riesgo  alto de tener osteoporosis. Siga estas instrucciones en su casa: Comida y bebida  Siga una dieta que incluya frutas frescas y verduras, cereales integrales, lcteos descremados y protenas magras.  Tome los suplementos vitamnicos y [employer] se lo haya indicado el mdico.  No beba alcohol si: ? Su mdico le indica no hacerlo. ? Est embarazada, puede estar embarazada o est tratando de quedar embarazada.  Si bebe alcohol: ? Limite la cantidad que consume de 0 a 1 medida por da. ? Est atenta a la cantidad de alcohol que hay en las bebidas que toma. En los [place], una medida equivale a una botella de cerveza de 12oz (328m), un vaso de vino de 5oz (1463m o un vaso de una bebida alcohlica de alta graduacin de 1oz (4436m Estilo de vidNavistar International Corporationlas encas a diario.  Mantngase activa. Haga al menos 35m48mos de ejercicio 5o ms das Hilton Hotelso consuma ningn producto que contenga nicotina o tabaco, como cigarrillos, cigarrillos electrnicos y tabaco de mascHigher education careers adviser necesita ayuda para dejar de fumar, consulte al mdico.  Si es sexualmente activa, practique sexo seguro.  Use un condn u otra forma de mtodo anticonceptivo (anticonceptivos) a fin de evitEnvironmental health practitionerTS (infecciones de transmisin sexual).  Si el mdico se lo indic, tome una dosis baja de aspirina diariamente a partir de los 50 a3 de edadAllensvillendo volver?  Visite al mdico una vez al ao para una visita de control.  Pregntele al mdico con qu frecuencia debe realizarse un control de la vista y los dientes.  Mantenga su esquema de vacunacin al da. Esta informacin no tiene comoMarine scientistconsejo del mdico. Asegrese de hacerle al mdico cualquier pregunta que tenga. Document Revised: 02/19/2018 Document Reviewed: 02/19/2018 Elsevier Patient Education  2020[place]

## 2020-01-14 NOTE — Patient Instructions (Addendum)
A few things to remember from today's visit:   Routine general medical examination at a health care facility  Moderate episode of recurrent major depressive disorder ([place]), Chronic  Encounter for HCV screening test for low risk patient - Plan: Hepatitis C antibody screen  Hyperlipidemia, unspecified hyperlipidemia type - Plan: Lipid panel  Visit for screening mammogram - Plan: Mammogram Digital Screening  Screening for endocrine, metabolic and immunity disorder - Plan: Comprehensive metabolic panel, Hemoglobin A1c   Sentimientos suicidas: cmo ayudarse a s mismo Suicidal Feelings: How to Help Yourself El suicidio es cuando uno pone fin a su propia vida. Hay muchas cosas que puede hacer para ayudarse a s mismo a sentirse mejor cuando lucha contra estos sentimientos. Hay muchos servicios y personas disponibles para apoyarlos a usted y a Psychologist, sport and exercise sentimientos similares.  Si alguna vez siente que puede lastimarse o Market researcher a Producer, television/film/video, o tiene pensamientos de poner fin a su vida, busque ayuda de inmediato. Para obtener ayuda:  Comunquese con el servicio de emergencias de su localidad (911 en los [place]).  La lnea de asistencia de los servicios de salud y servicios humanos de [place] [address] (211 en [place]).  Dirjase al servicio de urgencias ms cercano.  Llame a una lnea directa para la prevencin del suicidio y hable con un consejero capacitado. Las siguientes lneas directas para la prevencin del suicidio estn disponibles en [place]: ? 1-800-273-TALK [phone]). ? 1-800-SUICIDE [phone]). ? [phone]. Esta es una lnea directa para hispanohablantes. ? [phone]. Esta es una lnea directa para usuarios de [place]. ? 1-866-4-U-TREVOR [phone]). Esta es una lnea directa para jvenes lesbianas, homosexuales, bisexuales, transexuales o que cuestionan su identidad sexual. ? Para obtener una lista de las lneas  directas en [place], visite ParkingAffiliatePrograms.se.html  Comunquese con un centro de crisis o un centro de prevencin del suicidio local. Para encontrar un centro de crisis o un centro de prevencin del suicidio local: ? Llame al hospital, la clnica, la organizacin de servicios comunitarios, el centro de salud mental, el proveedor de servicios sociales o el departamento de salud locales. Solicite ayuda para conectarse con un centro de crisis. ? Para obtener [employer] de los centros de crisis en [place], visite: suicidepreventionlifeline.org ? Para obtener una lista de los centros de crisis en [place], visite: suicideprevention.ca [employer] a sentirse mejor   Promtase a s mismo que no tomar medidas drsticas cuando tenga sentimientos suicidas. Recuerde que hay esperanza. Muchas personas han superado pensamientos y sentimientos suicidas y usted [place]. Si ha tenido estos sentimientos antes, recurdese que puede superarlos nuevamente.  [place] a familiares, amigos, maestros o consejeros cmo se siente. Trate de no separarse de aquellos que se preocupan por usted y desean ayudarlo. Hable con [place], [place] no sienta ganas de ser sociable. Las conversaciones en persona son mejores para ayudarlos a que entiendan sus sentimientos.  Comunquese con un profesional de la salud mental y trabaje con esta persona de modo regular.  Elabore un plan de seguridad que pueda seguir durante una crisis. Incluya nmeros de telfonos de las lneas directas para la prevencin del suicidio, de profesionales de la salud mental y de amigos y familiares de confianza a los que puede llamar durante Engineer, maintenance (IT). Guarde estos nmeros en su telfono.  Si est pensando en tomar muchos medicamentos, entrgueselos a alguien que pueda administrrselos segn lo indicado. Si toma antidepresivos y est preocupado porque podra tomar una sobredosis, informe  a su mdico  de modo que pueda indicarle medicamentos ms seguros.  Trate de ajustarse a sus rutinas. Programe sus das y Psychologist, forensic. Haga del cuidado propio una prioridad.  Elabore una lista de objetivos realistas y tchelos una vez que los haya logrado. Los logros pueden Engineer, water.  Espere hasta que se sienta mejor antes de hacer cosas que le resulten difciles o desagradables.  Haga cosas que siempre disfrut para distraerse de sus sentimientos. Intente leer un libro, o escuchar o tocar msica. Pasar tiempo al [employer], en la naturaleza, puede ayudar a que se sienta mejor. Siga estas instrucciones en su casa:   Visite a su mdico de cabecera todos los aos para realizarse una revisin mdica.  Trabaje con un profesional de la salud mental segn lo sea necesario.  Consuma una dieta bien equilibrada e ingiera comidas a intervalos regulares.  Descanse mucho.  Si puede, haga actividad fsica. Slo 30 minutos de ejercicio al da pueden ayudarlo a sentirse mejor.  Use los medicamentos de venta libre y los recetados solamente como se lo haya indicado el mdico. Pregunte al profesional de salud mental sobre los posibles efectos secundarios de los medicamentos que est tomando.  No consuma alcohol ni drogas, y saque estas sustancias de su casa.  Saque armas, veneno, cuchillos y otros artculos mortales de su casa. Recomendaciones generales  Mantenga el lugar donde vive bien iluminado.  Cuando se sienta bien, escrbase una carta a usted mismo con sugerencias y palabras de apoyo que pueda leer cuando no se sienta bien.  Recuerde que las dificultades de la vida pueden superarse con [place]. Las afecciones pueden tratarse y usted puede aprender conductas y formas de pensar que lo ayudarn. Dnde buscar ms informacin  National Suicide Administrator ([place]): www.suicidepreventionlifeline.org  Hopeline ([place]): www.hopeline.com  Lowe's Companies for Suicide Prevention (Fundacin Estadounidense para la prevencin del suicidio): PromotionalLoans.co.za  The [employer] Corporation ([place]) (para jvenes [place], homosexuales, bisexuales, transexuales o que cuestionan su identidad sexual): www.thetrevorproject.org Comunquese con un mdico si:  Siente que es una carga para los dems.  Se siente preocupado, enojado, vengativo o tiene cambios de humor extremos.  Se ha apartado de su familia y amigos. Solicite ayuda inmediatamente si:  Habla acerca del suicidio o desea morir.  Comienza a hacer planes sobre cmo suicidarse.  Siente que no tiene motivos para vivir.  Comienza a hacer planes para poner en orden sus asuntos, despedirse o deshacerse de sus posesiones.  Siente culpa, vergenza o un dolor insoportable, y siente que no hay salida.  Est consumiendo alcohol y drogas con frecuencia.  Est teniendo conductas riesgosas que podran conducir a la [place]. Si tiene cualquiera de estos sntomas, [place] de inmediato. Llame a los servicios de emergencias, dirjase al departamento de emergencias o centro de crisis ms cercano, o llame a las lneas de ayuda para crisis de suicidio. Resumen  El suicidio es cuando uno se quita su propia vida.  Promtase a s mismo que no tomar medidas drsticas cuando tenga sentimientos suicidas.  [place] a familiares, amigos, maestros o consejeros cmo se siente.  Obtenga ayuda de inmediato si siente que es demasiado difcil lidiar con su vida y est pensando en el suicidio. Esta informacin no tiene Marine scientist el consejo del mdico. Asegrese de hacerle al mdico cualquier pregunta que tenga. Document Revised: 08/17/2018 Document Reviewed: 08/17/2018 Elsevier Patient Education  [place] IN THE AREA  This  is a list of options around [place], [place] that you can call and arrange an  appointment.  -[facility] [phone] [place] [phone] [facility] [phone] -[facility] [phone]  Dr Hardie Shackleton [phone] Dr Alexander Bergeron, MD [phone] Dr Chucky May, MD [phone])   Dr Allayne Butcher. Marcelino Freestone, MD [phone] Dr Geralyn Flash A. Lugo  Dr Sheralyn Boatman, MD [phone])  Dr Fredonia Highland, MD [phone]  Dr Luz Lex [phone]   Cuidados preventivos en las mujeres de 32 a 72 aos de edad Preventive Care 23-45 Years Old, Female Los cuidados preventivos hacen referencia a las opciones en cuanto a las visitas al mdico y al estilo de vida, las cuales pueden promover la salud y Musician. Esto puede comprender lo siguiente:  Un examen fsico anual. Esto tambin se puede llamar control de bienestar anual.  Visitas regulares al dentista y exmenes oculares.  Vacunas.  Estudios para Engineer, building services.  Opciones saludables de estilo de vida, como seguir una dieta saludable, hacer ejercicio regularmente, no usar drogas ni productos que contengan nicotina y tabaco, y limitar el consumo de alcohol. Qu puedo esperar para mi visita de cuidado preventivo? Examen fsico El mdico revisar lo siguiente:  IT consultant y [place]. Esto se puede usar para calcular el ndice de masa corporal ([place]), que indica si tiene un peso saludable.  Frecuencia cardaca y presin arterial.  Piel para detectar manchas anormales. Asesoramiento Su mdico puede preguntarle acerca de:  Consumo de tabaco, alcohol y drogas.  Su bienestar emocional.  El bienestar en el hogar y sus relaciones personales.  Su actividad sexual.  Sus hbitos de alimentacin.  Su trabajo y [place] laboral.  Mtodos anticonceptivos.  Su ciclo menstrual.  Sus antecedentes de Media planner. Qu vacunas necesito?  [place] antigripal  Se recomienda  aplicarse esta vacuna todos los [place]. Vacuna contra el ttanos, difteria y tos ferina (Tdap)  Es posible que tenga que aplicarse un refuerzo contra el ttanos y la difteria (DT) cada 10aos. Vacuna contra la varicela  Es posible que tenga que aplicrsela si no recibi esta vacuna. Vacuna contra el herpes zster (culebrilla)  Es posible que la necesite despus de los 15 aos de [place]. Vacuna contra el sarampin, rubola y paperas (SRP)  Es posible que necesite aplicarse al menos una dosis de la vacuna SRP si naci despus de [phone]. Podra tambin necesitar una segunda dosis. Vacuna antineumoccica conjugada (PCV13)  Puede necesitar esta vacuna si tiene determinadas enfermedades y no se vacun anteriormente. [place] antineumoccica de polisacridos (PPSV23)  Quizs tenga que aplicarse una o dos dosis si fuma o si tiene determinadas afecciones. Edward [place]olly antimeningoccica conjugada (MenACWY)  Puede necesitar esta vacuna si tiene determinadas afecciones. Vacuna contra la hepatitis A  Es posible que necesite esta vacuna si tiene ciertas afecciones o si viaja o trabaja en lugares en los que podra estar expuesta a la hepatitis A. Vacuna contra la hepatitis B  Es posible que necesite esta vacuna si tiene ciertas afecciones o si viaja o trabaja en lugares en los que podra estar expuesta a la hepatitis B. Vacuna antihaemophilus influenzae tipo B (Hib)  Puede necesitar esta vacuna si tiene determinadas afecciones. Vacuna contra el virus del papiloma humano (VPH)  Si el mdico se lo recomienda, Research scientist (physical sciences) tres dosis a lo largo de 6 meses. Puede recibir las vacunas en forma  de dosis individuales o en forma de dos o ms vacunas juntas en la misma inyeccin (vacunas combinadas). Hable con su mdico [employer] y beneficios de las vacunas combinadas. Qu pruebas necesito? Anlisis de [employer] de lpidos y colesterol. Estos se pueden verificar cada 5 aos o, con ms frecuencia,  si usted tiene ms de 57 aos de edad.  Anlisis de hepatitisC.  Anlisis de hepatitisB. Pruebas de deteccin  Pruebas de deteccin de cncer de pulmn. Es posible que se le realice esta prueba de deteccin a partir de los 16 aos de edad, si ha fumado durante 30 aos un paquete diario y sigue fumando o dej el hbito en algn momento en los ltimos 15 aos.  Pruebas de Programme researcher, broadcasting/film/video de Surveyor, minerals. Todos los adultos a partir de los 96 aos de edad y [place] 67 aos de edad deben hacerse esta prueba de deteccin. El mdico puede recomendarle las pruebas de deteccin a partir de los 37 aos de edad si corre un mayor riesgo. Le realizarn pruebas cada 1 a 10 aos, segn los [place] y el tipo de prueba de Programme researcher, broadcasting/film/video.  Pruebas de deteccin de la diabetes. Esto se Set designer un control del azcar en la sangre (glucosa) despus de no haber comido durante un periodo de tiempo (ayuno). Es posible que se le realice esta prueba cada 1 a 3 aos.  Mamografa. Se puede realizar cada 1 o 2 aos. Hable con su mdico sobre cundo debe comenzar a Engineer, manufacturing de [place] regular. Esto depende de si tiene antecedentes familiares de cncer de mama o no.  Pruebas de deteccin de cncer relacionado con las mutaciones del BRCA. Es posible que se las deba realizar si tiene antecedentes de cncer de mama, de ovario, de trompas o peritoneal.  Examen plvico y prueba de Papanicolaou. Esto se puede realizar cada 50aos a Renato Gails de los 21aos de edad. A partir de los 30 aos, esto se puede Optometrist cada 5 aos si usted se realiza una prueba de Papanicolaou en combinacin con una prueba de deteccin del virus del papiloma humano (VPH). Otras pruebas  Anlisis de enfermedades de transmisin sexual (ETS).  Densitometra sea. Esto se realiza para detectar osteoporosis. Se le puede realizar este examen de deteccin si tiene un riesgo alto de tener osteoporosis. Siga estas instrucciones en su  casa: Comida y bebida  Siga una dieta que incluya frutas frescas y verduras, cereales integrales, lcteos descremados y protenas magras.  Tome los suplementos vitamnicos y [employer] se lo haya indicado el mdico.  No beba alcohol si: ? Su mdico le indica no hacerlo. ? Est embarazada, puede estar embarazada o est tratando de quedar embarazada.  Si bebe alcohol: ? Limite la cantidad que consume de 0 a 1 medida por da. ? Est atenta a la cantidad de alcohol que hay en las bebidas que toma. En los [place], una medida equivale a una botella de cerveza de 12oz (345ml), un vaso de vino de 5oz (143ml) o un vaso de una bebida alcohlica de alta graduacin de 1oz (19ml). Estilo de [employer] Corporation y las encas a diario.  Mantngase activa. Haga al menos 64minutos de ejercicio 5o ms [employer].  No consuma ningn producto que contenga nicotina o tabaco, como cigarrillos, cigarrillos electrnicos y tabaco de Higher education careers adviser. Si necesita ayuda para dejar de fumar, consulte al mdico.  Si es sexualmente activa, practique sexo seguro. Use un condn u otra forma de mtodo anticonceptivo (  anticonceptivos) a fin de Environmental health practitioner e ITS (infecciones de transmisin sexual).  Si el mdico se lo indic, tome una dosis baja de aspirina diariamente a partir de los 70 aos de [place]. Cundo volver?  Visite al mdico una vez al ao para una visita de control.  Pregntele al mdico con qu frecuencia debe realizarse un control de la vista y los dientes.  Mantenga su esquema de vacunacin al da. Esta informacin no tiene Marine scientist el consejo del mdico. Asegrese de hacerle al mdico cualquier pregunta que tenga. Document Revised: 02/19/2018 Document Reviewed: 02/19/2018 Elsevier Patient Education  [place].

## 2020-01-14 NOTE — Assessment & Plan Note (Signed)
Continue nonpharmacologic treatment. Further recommendation will be given according to lipid panel numbers as well as 10-year CVD risk score.

## 2020-01-16 ENCOUNTER — Encounter: Payer: Self-pay | Admitting: Family Medicine

## 2020-01-17 LAB — COMPREHENSIVE METABOLIC PANEL
AG Ratio: 1.9 (calc) (ref 1.0–2.5)
ALT: 14 U/L (ref 6–29)
AST: 14 U/L (ref 10–30)
Albumin: 4.4 g/dL (ref 3.6–5.1)
Alkaline phosphatase (APISO): 55 U/L (ref 31–125)
BUN: 9 mg/dL (ref 7–25)
CO2: 24 mmol/L (ref 20–32)
Calcium: 9.3 mg/dL (ref 8.6–10.2)
Chloride: 105 mmol/L (ref 98–110)
Creat: 0.93 mg/dL (ref 0.50–1.10)
Globulin: 2.3 g/dL (calc) (ref 1.9–3.7)
Glucose, Bld: 97 mg/dL (ref 65–99)
Potassium: 4.1 mmol/L (ref 3.5–5.3)
Sodium: 139 mmol/L (ref 135–146)
Total Bilirubin: 0.3 mg/dL (ref 0.2–1.2)
Total Protein: 6.7 g/dL (ref 6.1–8.1)

## 2020-01-17 LAB — HEMOGLOBIN A1C
Hgb A1c MFr Bld: 5.3 % of total Hgb (ref <5.7)
Mean Plasma Glucose: 105 (calc)
eAG (mmol/L): 5.8 (calc)

## 2020-01-17 LAB — HEPATITIS C ANTIBODY
Hepatitis C Ab: NONREACTIVE
SIGNAL TO CUT-OFF: 0.01 (ref <1.00)

## 2020-01-17 LAB — LIPID PANEL
Cholesterol: 171 mg/dL (ref <200)
HDL: 47 mg/dL — ABNORMAL LOW (ref >=50)
LDL Cholesterol (Calc): 103 mg/dL (calc) — ABNORMAL HIGH
Non-HDL Cholesterol (Calc): 124 mg/dL (calc) (ref <130)
Total CHOL/HDL Ratio: 3.6 (calc) (ref <5.0)
Triglycerides: 111 mg/dL (ref <150)

## 2020-01-19 ENCOUNTER — Other Ambulatory Visit: Payer: Self-pay | Admitting: Family Medicine

## 2020-01-19 DIAGNOSIS — Z1231 Encounter for screening mammogram for malignant neoplasm of breast: Secondary | ICD-10-CM

## 2020-05-18 ENCOUNTER — Other Ambulatory Visit: Payer: Self-pay

## 2020-05-18 ENCOUNTER — Ambulatory Visit
Admission: RE | Admit: 2020-05-18 | Discharge: 2020-05-18 | Disposition: A | Discharge status: Home / Self Care | Payer: 59 | Source: Ambulatory Visit | Attending: Family Medicine | Admitting: Family Medicine

## 2020-05-18 DIAGNOSIS — Z1231 Encounter for screening mammogram for malignant neoplasm of breast: Secondary | ICD-10-CM

## 2020-07-24 ENCOUNTER — Telehealth: Payer: Self-pay | Admitting: Family Medicine

## 2020-07-24 NOTE — Telephone Encounter (Signed)
LMVM to schedule her 2 daughters with Dr. [place]

## 2020-07-24 NOTE — Telephone Encounter (Signed)
Okay to schedule both

## 2020-07-24 NOTE — Telephone Encounter (Signed)
This patient is wanting to know if her two daughters can establish care with you? Rebekah [name] 03/[birth year]/2006 [name] 04/07/2004 MRN [id number]  Can I schedule?

## 2020-09-06 ENCOUNTER — Other Ambulatory Visit: Payer: Self-pay

## 2020-09-06 ENCOUNTER — Ambulatory Visit (INDEPENDENT_AMBULATORY_CARE_PROVIDER_SITE_OTHER): Payer: 59 | Admitting: Family Medicine

## 2020-09-06 VITALS — BP 132/82 | HR 91 | Temp 98.8°F | Resp 12 | Ht 65.75 in | Wt 159.4 lb

## 2020-09-06 DIAGNOSIS — F331 Major depressive disorder, recurrent, moderate: Secondary | ICD-10-CM | POA: Diagnosis not present

## 2020-09-06 DIAGNOSIS — F39 Unspecified mood [affective] disorder: Secondary | ICD-10-CM

## 2020-09-06 DIAGNOSIS — R300 Dysuria: Secondary | ICD-10-CM

## 2020-09-06 DIAGNOSIS — N390 Urinary tract infection, site not specified: Secondary | ICD-10-CM | POA: Diagnosis not present

## 2020-09-06 DIAGNOSIS — R310 Gross hematuria: Secondary | ICD-10-CM | POA: Diagnosis not present

## 2020-09-06 DIAGNOSIS — R319 Hematuria, unspecified: Secondary | ICD-10-CM

## 2020-09-06 LAB — POCT URINALYSIS DIPSTICK
Bilirubin, UA: NEGATIVE
Blood, UA: POSITIVE
Glucose, UA: NEGATIVE
Ketones, UA: NEGATIVE
Nitrite, UA: NEGATIVE
Protein, UA: POSITIVE — AB
Spec Grav, UA: 1.02 (ref 1.010–1.025)
Urobilinogen, UA: 0.2 E.U./dL
pH, UA: 6 (ref 5.0–8.0)

## 2020-09-06 MED ORDER — LAMOTRIGINE 25 MG PO TABS: 25.0000 mg | ORAL_TABLET | Freq: Every day | ORAL | 0 refills | Status: DC

## 2020-09-06 MED ORDER — NITROFURANTOIN MONOHYD MACRO 100 MG PO CAPS: 100.0000 mg | ORAL_CAPSULE | Freq: Two times a day (BID) | ORAL | 0 refills | Status: AC

## 2020-09-06 MED ORDER — ESCITALOPRAM OXALATE 10 MG PO TABS: 10.0000 mg | ORAL_TABLET | Freq: Every day | ORAL | 1 refills | Status: DC

## 2020-09-06 NOTE — Progress Notes (Signed)
Chief Complaint  Patient presents with  . Urinary Tract Infection    HPI: Rebekah [name] is a 45 y.o. female, who is here today complaining of 1-2 weeks of urinary symptoms.  She felt like symptoms were improving but worse again 4 days ago.  Dysuria: Yes Urinary frequency: Yes Urinary urgency: Yes Incontinence: Negative. Gross hematuria: Yes x 1.  Negative for fever, chills, change in appetite. + Fatigue.  Abdominal pain: Mild suprapubic pressure, intermittent, not radiated. Mild lower back pain, achy like, not sure if related to urinary symptoms.  Nausea or vomiting: Negative.  Abnormal vaginal bleeding or discharge: Negative.  LMP:08/15/20. Sexual activity: Yes. Hx of UTI: Has had some in the past with gross hematuria. No hx of nephrolithiasis.  OTC medications for this problem: Azo and cranberry juice have helped.  She would like to follow on depression and anxiety: She feels like symptoms are getting worse. She was last seen on 01/14/20. In 2019 it was recommended to establish with psychiatrist. She saw therapist x 2 but interrupted due to the COVID 19 pandemia.  She has tried [employer]. Neither one help. She has not been able to established with psychiatrist. Sleeping 10 hours, usually she sleep well. She lives with her husband, 2 daughters,and her mother-in-law.  Caregiver of her mother in law, which has increased stress level.  Negative for suicidal thought but she wishes not to wake up the next day. She denies having a plan.  Depression screen [facility] 2/9 09/06/2020 01/14/2020 09/07/2015  Decreased Interest Down, Depressed, Hopeless PHQ - 2 Score Altered sleeping Tired, decreased energy Change in appetite Feeling bad or failure about yourself  3 2 0  Trouble concentrating Moving slowly or fidgety/restless Suicidal thoughts 3 3 0  PHQ-9 Score Difficult doing  work/chores Somewhat difficult Somewhat difficult Somewhat difficult   GAD 7 : Generalized Anxiety Score 09/06/2020 01/16/2020  Nervous, Anxious, on Edge 3 1  Control/stop worrying 3 1  Worry too much - different things 3 2  Trouble relaxing 3 3  Restless 3 3  Easily annoyed or irritable 3 3  Afraid - awful might happen 3 1  Total GAD 7 Score 21 14  Anxiety Difficulty Somewhat difficult Somewhat difficult   Review of Systems  Constitutional: Positive for fatigue. Negative for activity change, appetite change and fever.  Cardiovascular: Negative for chest pain and palpitations.  Gastrointestinal: Negative for abdominal distention.       Negative for changes in bowel habits.  Genitourinary: Negative for difficulty urinating and dyspareunia.  Skin: Negative for pallor and rash.  Neurological: Negative for syncope and weakness.  Psychiatric/Behavioral: Negative for confusion and hallucinations. The patient is nervous/anxious.   Rest see pertinent positives and negatives per HPI.  No current outpatient medications on file prior to visit.   No current facility-administered medications on file prior to visit.   Past Medical History:  Diagnosis Date  . Anxiety   . Depression   . Hyperlipidemia    No Known Allergies  Social History   Socioeconomic History  . Marital status: Married    Spouse name: Not on file  . Number of children: Not on file  . Years of education: Not on file  . Highest education level: Not on file  Occupational History  .  Not on file  Tobacco Use  . Smoking status: Never Smoker  . Smokeless tobacco: Former NeurosurgeonUser    Types: Chew  Substance and Sexual Activity  . Alcohol use: Yes    Alcohol/week: 0.0 standard drinks  . Drug use: No  . Sexual activity: Yes    Partners: Male  Other Topics Concern  . Not on file  Social History Narrative  . Not on file   Social Determinants of Health   Financial Resource Strain: Not on file  Food Insecurity: Not on  file  Transportation Needs: Not on file  Physical Activity: Not on file  Stress: Not on file  Social Connections: Not on file   Vitals:   09/06/20 0829  BP: 132/82  Pulse: 91  Resp: 12  Temp: 98.8 F (37.1 C)  SpO2: 99%   Body mass index is 25.92 kg/m.  Physical Exam Vitals and nursing note reviewed.  Constitutional:      General: She is not in acute distress.    Appearance: She is well-developed.  HENT:     Head: Normocephalic and atraumatic.  Eyes:     Conjunctiva/sclera: Conjunctivae normal.  Cardiovascular:     Rate and Rhythm: Normal rate and regular rhythm.  Pulmonary:     Effort: Pulmonary effort is normal. No respiratory distress.     Breath sounds: Normal breath sounds.  Abdominal:     Palpations: Abdomen is soft. There is no mass.     Tenderness: There is abdominal tenderness in the suprapubic area. There is no right CVA tenderness, left CVA tenderness, guarding or rebound.  Lymphadenopathy:     Cervical: No cervical adenopathy.  Skin:    General: Skin is warm.     Findings: No erythema.  Neurological:     Mental Status: She is alert and oriented to person, place, and time.  Psychiatric:        Mood and Affect: Mood is anxious and depressed.        Thought Content: Thought content does not include suicidal ideation. Thought content does not include suicidal plan.   ASSESSMENT AND PLAN:  Rebekah [name] was seen today for urinary tract infection.  Diagnoses and all orders for this visit: Orders Placed This Encounter  Procedures  . Culture, Urine  . POCT urinalysis dipstick    Dysuria Urine dipstick + blood,protein,and leuk. Ucx ordered.  Gross hematuria We discussed possible etiologies. No further work up recommended at this time. Further recommendations according to Ucx.  Urinary tract infection with hematuria, site unspecified Empiric treatment with macribid 100 mg bid x 5 days started today, We will tailor treatment according to Ucx and  susceptibility report. Adequate hydration. Instructed about warning signs.  -     nitrofurantoin, macrocrystal-monohydrate, (MACROBID) 100 MG capsule; Take 1 capsule (100 mg total) by mouth 2 (two) times daily for 5 days.  Moderate episode of recurrent major depressive disorder (HCC) Denies suicidal plan. Phone number and information given,so she can call if she has any thought. She would like to try Lexapro, some side effects discussed. Clearly instructed about warning signs.  -     escitalopram (LEXAPRO) 10 MG tablet; Take 1 tablet (10 mg total) by mouth daily.  Mood disorder (HCC) Low dose Lamotrigine also recommended. Some side effects discussed. CBT strongly recommended, contact information given.  -     lamoTRIgine (LAMICTAL) 25 MG tablet; Take 1 tablet (25 mg total) by mouth at bedtime.   Return in 4 weeks (on 10/04/2020) for Depression,anxiety.  Yukio Bisping G. [place], MD  [facility]. [place] office.   A few things to remember from today's visit:   Dysuria - Plan: POCT urinalysis dipstick, Culture, Urine, Culture, Urine  Gross hematuria  Urinary tract infection with hematuria, site unspecified - Plan: nitrofurantoin, macrocrystal-monohydrate, (MACROBID) 100 MG capsule  Moderate episode of recurrent major depressive disorder (HCC) - Plan: escitalopram (LEXAPRO) 10 MG tablet  Mood disorder (HCC) - Plan: lamoTRIgine (LAMICTAL) 25 MG tablet  Please be sure medication list is accurate. If a new problem present, please set up appointment sooner than planned today.   Pensamentos suicidas: Como ayudar a si mismo Suicidal Feelings: How to Help Yourself Suicdio  quando voc tira a sua prpria vida. H muitas coisas que voc pode fazer para se sentir melhor Psychologist, counselling lutando contra essa ideia. Muitos servios e pessoas esto disponveis para dar apoio a voc e a outras pessoas que lutam contra ideias semelhantes.  Se sentir vontade de se Clinical cytogeneticist ou de  ferir [employer]ill [employer] ou pensar em tirar a prpria vida, procure ajuda imediatamente. Para obter ajuda:  Ligue para o nmero de Technical brewer (911, nos EUA).  A linha direta de servios humanos e de sade da [employer] [phone] nos [place]).  V para o pronto-socorro mais prximo.  Ligue para um servio de preveno do suicdio para falar com um consultor treinado. Os seguintes telefones de servios de preveno do suicdio esto disponveis nos [place]: ? 1-800-273-TALK ([phone]). ? 1-800-SUICIDE [phone]). ? [phone]. Este  um servio para falantes de espanhol. ? [phone]. Este  um servio para usurios TTY. ? 1-866-4-U-TREVOR [phone]). Este  um servio para lsbicas, gays, bissexuais, transgneros ou jovens em dvida. ? Para obter [employer] telefones de servios no [place], acesse ItCheaper.dk.html  Entre em contato com um centro de crises ou com um centro de preveno ao suicdio local. Para encontrar um centro de crises ou um centro de preveno ao suicdio: ? Ligue para o hospital, clnica, organizao de servios comunitrios, centro de sade mental, fornecedor de servios sociais ou departamento de sade local. Pea ajuda entrando em contato com um centro de crise. ? Para obter [employer] centros de crise nos [place], acesse: suicidepreventionlifeline.org ? Para obter [employer] centros de crise no [place], acesse: suicideprevention.ca O que pode ajudar a se sentir melhor  Prometa a si mesmo que no tomar nenhuma atitude drstica quando tiver pensamentos suicidas. Lembre-se dos momentos em que voc sentia esperana. Muitas pessoas superaram os pensamentos e sentimentos suicidas, e voc tambm pode super-los. Se voc j teve esses sentimentos antes, lembre-se de que voc pode enfrent-los novamente.  Conte  sua [place], amigos, professores ou consultores como voc est se sentindo.  Tente no se afastar daqueles que se importam com voc e querem ajud-lo. Fale com algum todos os dias, mesmo que no se sinta socivel. Conversa olho no olho  melhor para ajud-los a entender o que voc est sentindo.  Entre em contato com um profissional de sade mental e consulte-se com essa pessoa regularmente.  Elabore um plano de segurana que voc possa seguir durante uma crise. Inclua os nmeros de telefone de servios de preveno do suicdio, profissionais de sade mental e amigos e familiares de confiana para quem voc pode ligar em caso de emergncia. Salve esses nmeros no seu telefone.  Se estiver pensando em tomar muitos medicamentos, deixe seus medicamentos com algum que possa entreg-los conforme a prescrio mdica. Se estiver tomando antidepressivos e com medo de  sofrer uma sobredosagem, converse com seu mdico para que ele possa prescrever medicamentos mais seguros.  Tente manter sua rotina e siga uma programao todos os dias. Priorize seu autocuidado.  Faa uma lista de objetivos realistas e risque-os assim que alcan-los. Realizaes podem proporcionar uma sensao de mrito e recompensa.  Espere at [employer] se sentindo melhor antes de fazer coisas que acha difceis ou chatas.  Faa coisas que voc sempre gostou para distrair sua mente para [employer] seus sentimentos. Tente ler um livro e ouvir ou tocar [employer]. Passar tempo fora de casa, junto  natureza, pode ajud-lo a se Passenger transport manager.   Siga estas instrues em casa:  Consulte seu mdico todos os anos para uma avaliao.  Consulte-se com um profissional de sade mental, conforme necessrio.  Siga uma alimentao balanceada e faa as refeies em horrios regulares.  Repouse bastante.  Faa exerccios, se puder. Apenas 30 minutos de exerccio por dia podem ajud-lo a se sentir melhor.  Tome medicamentos vendidos com ou sem receita mdica somente de acordo com as indicaes do seu mdico. Pergunte ao seu  profissional de sade mental sobre os possveis efeitos colaterais de todos os medicamentos que estiver tomando.  No use lcool ou drogas e tire essas substncias de sua casa.  Remova armas, venenos, facas e outros itens potencialmente fatais de sua casa.   Recomendaes gerais  [place] o ambiente em que vive bem iluminado.  Quando estiver se sentindo melhor, escreva uma carta para voc mesmo sobre dicas e apoio que voc possa ler quando no estiver bem.  Lembre-se que as dificuldades da vida podem ser superadas com ajuda. As doenas podem ser tratadas, e voc pode aprender comportamentos e maneiras de pensar que o ajudaro. Onde conseguir [employer] Suicide Prevention Lifeline (Linha da Vida de Preveno ao Suicdio Nacional): www.suicidepreventionlifeline.org  Hopeline (Linha da esperana): www.hopeline.com  [employer] for Suicide Prevention [place] Americana para a Preveno do Suicdio): https://www.ayers.com/  [employer] ([place]) (para lsbicas, gays, bissexuais, transgnero ou jovens em dvida): www.thetrevorproject.AK [employer] Holding Corporation of Mental Health (Instituto Nacional de Sade Mental): http://www.wall.info/ Entre em contato com um mdico se:  Voc se sentir como se fosse um peso para os outros.  Voc se sentir agitado, nervoso, vingativo ou tiver Boeing de humor.  Voc se afastar da [employer]. Busque ajuda imediatamente se:  Estiver Starbucks Corporation suicdio ou com desejo de Advertising account planner.  Comear a fazer planos de Technical brewer suicdio.  Sentir que no tem motivos para viver.  Comear a fazer planos para colocar suas pendncias em ordem, despedir-se ou entregar seu patrimnio.  Sentir culpa, vergonha ou dor insuportvel, e parecer que no h sada.  Estiver usando drogas ou lcool com muita frequncia.  Estiver se envolvendo em comportamentos de risco que Investment banker, operational. Se voc apresentar qualquer um desses sintomas, procure ajuda imediatamente. Ligue para os servios de Museum/gallery exhibitions officer, v ao pronto-socorro ou centro de crises mais prximo, ou ligue para um servio de ajuda  crise suicida. Resumo  Suicdio  [employer] a sua prpria vida.  Prometa a si mesmo que no tomar nenhuma atitude drstica quando tiver pensamentos suicidas.  Conte  sua [place], amigos, professores ou consultores como voc est se sentindo.  Busque ajuda imediatamente se voc comear a fazer planos de Technical brewer suicdio. Estas informaes no se destinam a substituir as recomendaes de seu mdico. No deixe de discutir quaisquer dvidas com seu mdico. Document Revised: 03/13/2020 Document  Reviewed: 03/13/2020 Elsevier Patient Education  2021 Elsevier Inc.   Infeccin urinaria en los adultos Urinary Tract Infection, Adult Una infeccin urinaria (IU) puede ocurrir en Corporate treasurer de las vas urinarias. Las vas urinarias incluyen lo siguiente:  Los riones.  Los urteres.  La vejiga.  La uretra. Estos rganos fabrican, almacenan y eliminan el pis (orina) del cuerpo. Cules son las causas? La causa de esta infeccin es la presencia de grmenes (bacterias) en la zona genital. Estos grmenes proliferan y causan hinchazn (inflamacin) de las vas urinarias. Qu incrementa el riesgo? Los siguientes factores pueden hacer que sea ms propensa a Clinical cytogeneticist afeccin:  Usar un tubo delgado y pequeo (catter) para drenar el pis.  Imposibilidad de Sales executive momento de orinar o defecar (incontinencia).  Ser mujer. Si usted [employer] Corporation, estas cosas pueden aumentar el riesgo: ? Usar estos mtodos para evitar un embarazo:  Un medicamento que Al[employer]a Inc espermatozoides (espermicida).  Un dispositivo que impide el paso de los espermatozoides (diafragma). ? Tenerniveles bajos de una hormona femenina (estrgeno). ? Estar embarazada. Es ms probable que sufra  esta afeccin si:  Tiene genes que [employer].  Es sexualmente activa.  Toma antibiticos.  Tiene dificultad para orinar debido a: ? Su prstata es ms grande de lo normal, si usted es hombre. ? Obstruccin en la parte del cuerpo que drena el pis de la vejiga. ? Clculo renal. ? Un trastorno nervioso que afecta la vejiga. ? No bebe una cantidad suficiente de lquido. ? No hace pis con la frecuencia suficiente.  Tiene otras afecciones, como: ? Diabetes. ? Un sistema que combate las enfermedades (sistema inmunitario) debilitado. ? Anemia drepanoctica. ? Gota. ? Lesin en la columna vertebral. Cules son los signos o sntomas? Los sntomas de esta afeccin incluyen:  Necesidad inmediata de hacer pis.  Hacer poca cantidad de pis con mucha frecuencia.  Dolor o ardor al American Standard Companies.  Sangre en el pis.  Pis que huele mal o anormal.  Dificultad para hacer pis.  Pis turbio.  Lquido que sale de la vagina, si es [place].  Dolor en la barriga o en la parte baja de la espalda. Otros sntomas pueden incluir los siguientes:  Vmitos.  Falta de apetito.  Sentirse confundido (confuso). Este puede ser Financial risk analyst sntoma en los adultos [place].  Sentirse cansado y malhumorado (irritable).  Grant Ruts.  Materia fecal lquida (diarrea). Cmo se trata?  Recibir antibiticos.  Recibir otros medicamentos.  Beber una cantidad suficiente de agua. En algunos casos, es posible que deba consultar a Music therapist. Siga estas instrucciones en su casa: Medicamentos  Use los medicamentos de venta libre y los recetados solamente como se lo haya indicado el mdico.  Si le recetaron un antibitico, tmelo como se lo haya indicado el mdico. No deje de tomarlo aunque comience a sentirse mejor. Instrucciones generales  Asegrese de hacer lo siguiente: ? Haga pis hasta que la vejiga quede vaca. ? Nocontenga el pis durante mucho tiempo. ? Vaciar la vejiga despus de Sports administrator. ? Lmpiese de adelante hacia atrs despus de hacer pis o defecar, si es mujer. Use cada trozo de papel higinico solo una vez cuando se limpie.  Beba suficiente lquido como para Pharmacologist la orina de color amarillo plido.  Cumpla con todas las visitas de seguimiento.   Comunquese con un mdico si:  No mejora despus de 1 o 2das de tratamiento.  Los sntomas desaparecen y Stage manager. Solicite ayuda de inmediato si:  Tiene un  dolor muy intenso en la espalda.  Tiene dolor muy intenso en la parte baja de la barriga.  Tiene fiebre.  Tiene escalofros.  Se siente como si fuera a vomitar o vomita. Resumen  Una infeccin urinaria (IU) puede ocurrir en Corporate treasurer de las vas [place].  Esta afeccin es causada por la presencia de grmenes en la zona genital.  Existen muchos factores de riesgo de sufrir una IU.  El tratamiento incluye antibiticos.  Beba suficiente lquido como para Pharmacologist la orina de color amarillo plido. Esta informacin no tiene Theme park manager el consejo del mdico. Asegrese de hacerle al mdico cualquier pregunta que tenga. Document Revised: 03/17/2020 Document Reviewed: 03/17/2020 Elsevier Patient Education  2021 [employer].

## 2020-09-06 NOTE — Progress Notes (Signed)
Scheduled 10/09/2020 at 12

## 2020-09-06 NOTE — Patient Instructions (Addendum)
A few things to remember from today's visit:   Dysuria - Plan: POCT urinalysis dipstick, Culture, Urine, Culture, Urine  Gross hematuria  Urinary tract infection with hematuria, site unspecified - Plan: nitrofurantoin, macrocrystal-monohydrate, (MACROBID) 100 MG capsule  Moderate episode of recurrent major depressive disorder (HCC) - Plan: escitalopram (LEXAPRO) 10 MG tablet  Mood disorder (HCC) - Plan: lamoTRIgine (LAMICTAL) 25 MG tablet  Please be sure medication list is accurate. If a new problem present, please set up appointment sooner than planned today.   Pensamentos suicidas: Como ayudar a si mismo Suicidal Feelings: How to Help Yourself Suicdio  quando voc tira a sua prpria vida. H muitas coisas que voc pode fazer para se sentir melhor Psychologist, counsellingquando estiver lutando contra essa ideia. Muitos servios e pessoas esto disponveis para dar apoio a voc e a outras pessoas que lutam contra ideias semelhantes.  Se sentir vontade de se Clinical cytogeneticistmachucar ou de ferir Merrill Lynchoutras pessoas ou pensar em tirar a prpria vida, procure ajuda imediatamente. Para obter ajuda:  Ligue para o nmero de Technical breweremergncia local (911, nos EUA).  A linha direta de servios humanos e de sade da [employer] [phone](211 nos [place]A).  V para o pronto-socorro mais prximo.  Ligue para um servio de preveno do suicdio para falar com um consultor treinado. Os seguintes telefones de servios de preveno do suicdio esto disponveis nos [place]: ? 1-800-273-TALK ([phone]). ? 1-800-SUICIDE [phone]). ? [phone]. Este  um servio para falantes de espanhol. ? [phone]. Este  um servio para usurios TTY. ? 1-866-4-U-TREVOR [phone]). Este  um servio para lsbicas, gays, bissexuais, transgneros ou jovens em dvida. ? Para obter DTE Energy Companyuma lista dos telefones de servios no [place], acesse ItCheaper.dkwww.suicide.org/hotlines/international/canada-suicide-hotlines.html  Entre em contato com um centro de crises ou  com um centro de preveno ao suicdio local. Para encontrar um centro de crises ou um centro de preveno ao suicdio: ? Ligue para o hospital, clnica, organizao de servios comunitrios, centro de sade mental, fornecedor de servios sociais ou departamento de sade local. Pea ajuda entrando em contato com um centro de crise. ? Para obter DTE Energy Companyuma lista dos centros de crise nos [place], acesse: suicidepreventionlifeline.org ? Para obter DTE Energy Companyuma lista dos centros de crise no [place], acesse: suicideprevention.ca O que pode ajudar a se sentir melhor  Prometa a si mesmo que no tomar nenhuma atitude drstica quando tiver pensamentos suicidas. Lembre-se dos momentos em que voc sentia esperana. Muitas pessoas superaram os pensamentos e sentimentos suicidas, e voc tambm pode super-los. Se voc j teve esses sentimentos antes, lembre-se de que voc pode enfrent-los novamente.  Conte  sua [place], amigos, professores ou consultores como voc est se sentindo. Tente no se afastar daqueles que se importam com voc e querem ajud-lo. Fale com algum todos os dias, mesmo que no se sinta socivel. Conversa olho no olho  melhor para ajud-los a entender o que voc est sentindo.  Entre em contato com um profissional de sade mental e consulte-se com essa pessoa regularmente.  Elabore um plano de segurana que voc possa seguir durante uma crise. Inclua os nmeros de telefone de servios de preveno do suicdio, profissionais de sade mental e amigos e familiares de confiana para quem voc pode ligar em caso de emergncia. Salve esses nmeros no seu telefone.  Se estiver pensando em tomar muitos medicamentos, deixe seus medicamentos com algum que possa entreg-los conforme a prescrio mdica. Se estiver tomando antidepressivos e com medo de sofrer uma sobredosagem, converse com seu mdico para que ele possa prescrever medicamentos  mais seguros.  Tente manter sua rotina e siga uma programao todos  os dias. Priorize seu autocuidado.  Faa uma lista de objetivos realistas e risque-os assim que alcan-los. Realizaes podem proporcionar uma sensao de mrito e recompensa.  Espere at The [employer] se sentindo melhor antes de fazer coisas que acha difceis ou chatas.  Faa coisas que voc sempre gostou para distrair sua mente para [employer] seus sentimentos. Tente ler um livro e ouvir ou tocar [employer]. Passar tempo fora de casa, junto  natureza, pode ajud-lo a se Passenger transport manager.   Siga estas instrues em casa:  Consulte seu mdico todos os anos para uma avaliao.  Consulte-se com um profissional de sade mental, conforme necessrio.  Siga uma alimentao balanceada e faa as refeies em horrios regulares.  Repouse bastante.  Faa exerccios, se puder. Apenas 30 minutos de exerccio por dia podem ajud-lo a se sentir melhor.  Tome medicamentos vendidos com ou sem receita mdica somente de acordo com as indicaes do seu mdico. Pergunte ao seu profissional de sade mental sobre os possveis efeitos colaterais de todos os medicamentos que estiver tomando.  No use lcool ou drogas e tire essas substncias de sua casa.  Remova armas, venenos, facas e outros itens potencialmente fatais de sua casa.   Recomendaes gerais  [place] o ambiente em que vive bem iluminado.  Quando estiver se sentindo melhor, escreva uma carta para voc mesmo sobre dicas e apoio que voc possa ler quando no estiver bem.  Lembre-se que as dificuldades da vida podem ser superadas com ajuda. As doenas podem ser tratadas, e voc pode aprender comportamentos e maneiras de pensar que o ajudaro. Onde conseguir [employer] Suicide Prevention Lifeline (Linha da Vida de Preveno ao Suicdio Nacional): www.suicidepreventionlifeline.org  Hopeline (Linha da esperana): www.hopeline.com  [employer] for Suicide Prevention [place] [employer] Americana para a Preveno do Suicdio): https://www.ayers.com/  The  [employer]M [employer] ([place]) (para lsbicas, gays, bissexuais, transgnero ou jovens em dvida): www.thetrevorproject.AK Steel Holding Corporation of Mental Health (Instituto Nacional de Sade Mental): http://www.wall.info/ Entre em contato com um mdico se:  Voc se sentir como se fosse um peso para os outros.  Voc se sentir agitado, nervoso, vingativo ou tiver Boeing de humor.  Voc se afastar da [employer]. Busque ajuda imediatamente se:  Estiver Starbucks Corporation suicdio ou com desejo de Advertising account planner.  Comear a fazer planos de Technical brewer suicdio.  Sentir que no tem motivos para viver.  Comear a fazer planos para colocar suas pendncias em ordem, despedir-se ou entregar seu patrimnio.  Sentir culpa, vergonha ou dor insuportvel, e parecer que no h sada.  Estiver usando drogas ou lcool com muita frequncia.  Estiver se envolvendo em comportamentos de risco que Insurance risk surveyor. Se voc apresentar qualquer um desses sintomas, procure ajuda imediatamente. Ligue para os servios de Museum/gallery exhibitions officer, v ao pronto-socorro ou centro de crises mais prximo, ou ligue para um servio de ajuda  crise suicida. Resumo  Suicdio  Apple Computer a sua prpria vida.  Prometa a si mesmo que no tomar nenhuma atitude drstica quando tiver pensamentos suicidas.  Conte  sua [place], amigos, professores ou consultores como voc est se sentindo.  Busque ajuda imediatamente se voc comear a fazer planos de Technical brewer suicdio. Estas informaes no se destinam a substituir as recomendaes de seu mdico. No deixe de discutir quaisquer dvidas com seu mdico. Document Revised: 03/13/2020 Document Reviewed: 03/13/2020 Elsevier Patient Education  2021 Elsevier Inc.   Pervis Hocking Erick Alley  en los adultos Urinary Tract Infection, Adult Una infeccin urinaria (IU) puede ocurrir en Corporate treasurer de las vas [place]. Las vas urinarias  incluyen lo siguiente:  Los riones.  Los urteres.  La vejiga.  La uretra. Estos rganos fabrican, almacenan y eliminan el pis (orina) del cuerpo. Cules son las causas? La causa de esta infeccin es la presencia de grmenes (bacterias) en la zona genital. Estos grmenes proliferan y causan hinchazn (inflamacin) de las vas urinarias. Qu incrementa el riesgo? Los siguientes factores pueden hacer que sea ms propensa a Clinical cytogeneticist afeccin:  Usar un tubo delgado y pequeo (catter) para drenar el pis.  Imposibilidad de Sales executive momento de orinar o defecar (incontinencia).  Ser mujer. Si usted [employer]G Corporation, estas cosas pueden aumentar el riesgo: ? Usar estos mtodos para evitar un embarazo:  Un medicamento que Alcoa Inc espermatozoides (espermicida).  Un dispositivo que impide el paso de los espermatozoides (diafragma). ? Tenerniveles bajos de una hormona femenina (estrgeno). ? Estar embarazada. Es ms probable que sufra esta afeccin si:  Tiene genes que [employer].  Es sexualmente activa.  Toma antibiticos.  Tiene dificultad para orinar debido a: ? Su prstata es ms grande de lo normal, si usted es hombre. ? Obstruccin en la parte del cuerpo que drena el pis de la vejiga. ? Clculo renal. ? Un trastorno nervioso que afecta la vejiga. ? No bebe una cantidad suficiente de lquido. ? No hace pis con la frecuencia suficiente.  Tiene otras afecciones, como: ? Diabetes. ? Un sistema que combate las enfermedades (sistema inmunitario) debilitado. ? Anemia drepanoctica. ? Gota. ? Lesin en la columna vertebral. Cules son los signos o sntomas? Los sntomas de esta afeccin incluyen:  Necesidad inmediata de hacer pis.  Hacer poca cantidad de pis con mucha frecuencia.  Dolor o ardor al American Standard Companies.  Sangre en el pis.  Pis que huele mal o anormal.  Dificultad para hacer pis.  Pis turbio.  Lquido que sale de la vagina, si es [place].  Dolor  en la barriga o en la parte baja de la espalda. Otros sntomas pueden incluir los siguientes:  Vmitos.  Falta de apetito.  Sentirse confundido (confuso). Este puede ser Financial risk analyst sntoma en los adultos [place].  Sentirse cansado y malhumorado (irritable).  Grant Ruts.  Materia fecal lquida (diarrea). Cmo se trata?  Recibir antibiticos.  Recibir otros medicamentos.  Beber una cantidad suficiente de agua. En algunos casos, es posible que deba consultar a Music therapist. Siga estas instrucciones en su casa: Medicamentos  Use los medicamentos de venta libre y los recetados solamente como se lo haya indicado el mdico.  Si le recetaron un antibitico, tmelo como se lo haya indicado el mdico. No deje de tomarlo aunque comience a sentirse mejor. Instrucciones generales  Asegrese de hacer lo siguiente: ? Haga pis hasta que la vejiga quede vaca. ? Nocontenga el pis durante mucho tiempo. ? Vaciar la vejiga despus de Doctor, hospital. ? Lmpiese de adelante hacia atrs despus de hacer pis o defecar, si es mujer. Use cada trozo de papel higinico solo una vez cuando se limpie.  Beba suficiente lquido como para Pharmacologist la orina de color amarillo plido.  Cumpla con todas las visitas de seguimiento.   Comunquese con un mdico si:  No mejora despus de 1 o 2das de tratamiento.  Los sntomas desaparecen y Stage manager. Solicite ayuda de inmediato si:  Tiene un dolor muy intenso en la espalda.  Tiene dolor muy intenso en la  parte baja de la barriga.  Tiene fiebre.  Tiene escalofros.  Se siente como si fuera a vomitar o vomita. Resumen  Una infeccin urinaria (IU) puede ocurrir en Corporate treasurer de las vas [place].  Esta afeccin es causada por la presencia de grmenes en la zona genital.  Existen muchos factores de riesgo de sufrir una IU.  El tratamiento incluye antibiticos.  Beba suficiente lquido como para Pharmacologist la orina de color amarillo  plido. Esta informacin no tiene Theme park manager el consejo del mdico. Asegrese de hacerle al mdico cualquier pregunta que tenga. Document Revised: 03/17/2020 Document Reviewed: 03/17/2020 Elsevier Patient Education  2021 [employer].

## 2020-09-08 LAB — URINE CULTURE
MICRO NUMBER:: 11792411
SPECIMEN QUALITY:: ADEQUATE

## 2020-09-10 ENCOUNTER — Encounter: Payer: Self-pay | Admitting: Family Medicine

## 2020-09-18 ENCOUNTER — Ambulatory Visit (INDEPENDENT_AMBULATORY_CARE_PROVIDER_SITE_OTHER): Payer: 59 | Admitting: Psychology

## 2020-09-18 DIAGNOSIS — F331 Major depressive disorder, recurrent, moderate: Secondary | ICD-10-CM | POA: Diagnosis not present

## 2020-09-28 ENCOUNTER — Ambulatory Visit (INDEPENDENT_AMBULATORY_CARE_PROVIDER_SITE_OTHER): Payer: 59 | Admitting: Psychology

## 2020-09-28 DIAGNOSIS — F331 Major depressive disorder, recurrent, moderate: Secondary | ICD-10-CM | POA: Diagnosis not present

## 2020-10-04 ENCOUNTER — Ambulatory Visit (INDEPENDENT_AMBULATORY_CARE_PROVIDER_SITE_OTHER): Payer: 59 | Admitting: Psychology

## 2020-10-04 DIAGNOSIS — F331 Major depressive disorder, recurrent, moderate: Secondary | ICD-10-CM

## 2020-10-09 ENCOUNTER — Other Ambulatory Visit: Payer: Self-pay

## 2020-10-09 ENCOUNTER — Encounter: Payer: Self-pay | Admitting: Family Medicine

## 2020-10-09 ENCOUNTER — Ambulatory Visit (INDEPENDENT_AMBULATORY_CARE_PROVIDER_SITE_OTHER): Payer: 59 | Admitting: Family Medicine

## 2020-10-09 VITALS — BP 120/70 | HR 63 | Temp 98.1°F | Resp 12 | Ht 65.75 in | Wt 159.0 lb

## 2020-10-09 DIAGNOSIS — F39 Unspecified mood [affective] disorder: Secondary | ICD-10-CM

## 2020-10-09 DIAGNOSIS — F331 Major depressive disorder, recurrent, moderate: Secondary | ICD-10-CM | POA: Diagnosis not present

## 2020-10-09 MED ORDER — ESCITALOPRAM OXALATE 10 MG PO TABS: 10.0000 mg | ORAL_TABLET | Freq: Every day | ORAL | 1 refills | Status: DC

## 2020-10-09 MED ORDER — LAMOTRIGINE 25 MG PO TABS: 25.0000 mg | ORAL_TABLET | Freq: Two times a day (BID) | ORAL | 1 refills | Status: DC

## 2020-10-09 NOTE — Progress Notes (Signed)
HPI:  Rebekah [name] is a 45 y.o. female, who is here today to follow on recent OV. She was last seen on 09/06/2020, when she was complaining about worsening depression. Currently she is on Lamictal 25 mg at night and Lexapro 10 mg daily. She reported improvement of symptoms. She is following with psychotherapist weekly. She has not noted side effects. Denies suicidal thoughts or ideation. Her sister-in-law is visiting, this has helped a lot. 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  Depression screen [facility] 2/9 10/09/2020 09/06/2020 01/14/2020 09/07/2015  Decreased Interest 1 3 1 2   Down, Depressed, Hopeless 2 3 2 1   PHQ - 2 Score 3 6 3 3   Altered sleeping 1 2 3 2   Tired, decreased energy 2 3 3 3   Change in appetite 3 3 3 2   Feeling bad or failure about yourself  2 3 2  0  Trouble concentrating 2 2 3 1   Moving slowly or fidgety/restless 0 1 3 1   Suicidal thoughts 0 3 3 0  PHQ-9 Score 13 23 23 12   Difficult doing work/chores Somewhat difficult Somewhat difficult Somewhat difficult Somewhat difficult    Depression screen [facility] 2/9 10/09/2020 09/06/2020 01/14/2020 09/07/2015  Decreased Interest 1 3 1 2   Down, Depressed, Hopeless 2 3 2 1   PHQ - 2 Score 3 6 3 3   Altered sleeping 1 2 3 2   Tired, decreased energy 2 3 3 3   Change in appetite 3 3 3 2   Feeling bad or failure about yourself  2 3 2  0  Trouble concentrating 2 2 3 1   Moving slowly or fidgety/restless 0 1 3 1   Suicidal thoughts 0 3 3 0  PHQ-9 Score 13 23 23 12   Difficult doing work/chores Somewhat difficult Somewhat difficult Somewhat difficult Somewhat difficult    Review of Systems  Constitutional: Positive for fatigue. Negative for activity change, appetite change, chills and fever.  Respiratory: Negative for chest tightness and shortness of breath.   Cardiovascular: Negative for chest pain and palpitations.   Gastrointestinal: Negative for abdominal pain, nausea and vomiting.       Negative for changes in bowel habits.  Musculoskeletal: Negative for gait problem and myalgias.  Skin: Negative for color change and rash.  Neurological: Negative for syncope, weakness and headaches.  Psychiatric/Behavioral: Negative for confusion and hallucinations.  Rest see pertinent positives and negatives per HPI.  No current outpatient medications on file prior to visit.   No current facility-administered medications on file prior to visit.   Past Medical History:  Diagnosis Date  . Anxiety   . Depression   . Hyperlipidemia    No Known Allergies  Social History   Socioeconomic History  . Marital status: Married    Spouse name: Not on file  . Number of children: Not on file  . Years of education: Not on file  . Highest education level: Not on file  Occupational History  . Not on file  Tobacco Use  . Smoking status: Never Smoker  . Smokeless tobacco: Former    Types: Chew  Substance and Sexual Activity  . Alcohol use: Yes    Alcohol/week: 0.0 standard drinks  . Drug use: No  . Sexual activity: Yes    Partners: Male  Other Topics Concern  . Not on file  Social History Narrative  . Not on file  Social Determinants of Health   Financial Resource Strain: Not on file  Food Insecurity: Not on file  Transportation Needs: Not on file  Physical Activity: Not on file  Stress: Not on file  Social Connections: Not on file   Vitals:   10/09/20 1214  BP: 120/70  Pulse: 63  Resp: 12  Temp: 98.1 F (36.7 C)  SpO2: 99%   Wt Readings from Last 3 Encounters:  10/09/20 159 lb (72.1 kg)  09/06/20 159 lb 6.4 oz (72.3 kg)  01/14/20 158 lb (71.7 kg)   Body mass index is 25.86 kg/m.  Physical Exam Vitals and nursing note reviewed.  Constitutional:      General: She is not in acute distress.    Appearance: She is well-developed and well-groomed.  HENT:     Head: Normocephalic and  atraumatic.  Eyes:     Conjunctiva/sclera: Conjunctivae normal.  Cardiovascular:     Rate and Rhythm: Normal rate and regular rhythm.     Heart sounds: No murmur heard.   Pulmonary:     Effort: Pulmonary effort is normal. No respiratory distress.     Breath sounds: Normal breath sounds.  Abdominal:     Palpations: Abdomen is soft. There is no mass.     Tenderness: There is no abdominal tenderness.  Skin:    General: Skin is warm.     Findings: No erythema.  Neurological:     Mental Status: She is alert and oriented to person, place, and time.     Gait: Gait normal.  Psychiatric:        Thought Content: Thought content does not include suicidal ideation. Thought content does not include suicidal plan.   ASSESSMENT AND PLAN:  Rebekah [name] was seen today for follow-up.  Diagnoses and all orders for this visit:  Moderate episode of recurrent major depressive disorder (HCC) Problem has improved. PHQ down to 13 (he was 23). Continue Lexapro 10 mg daily. We reviewed some side effects. Continue weekly CBT. Clearly instructed about warning signs. Follow-up in 3 months, before if needed.  -     escitalopram (LEXAPRO) 10 MG tablet; Take 1 tablet (10 mg total) by mouth daily.  Mood disorder (HCC) She has tolerated medication well. She agrees with increasing lamotrigine dose from 25 mg daily to twice daily. No changes in Lexapro dose. Currently on CBT.  -     lamoTRIgine (LAMICTAL) 25 MG tablet; Take 1 tablet (25 mg total) by mouth 2 (two) times daily.  Return in about 3 months (around 01/09/2021) for depression and mood disorder..   Addison Whidbee G. [place], MD  [facility]. [place] office.   A few things to remember from today's visit:   Moderate episode of recurrent major depressive disorder (HCC)  Mood disorder (HCC) - Plan: lamoTRIgine (LAMICTAL) 25 MG tablet  If you need refills please call your pharmacy. Do not use My Chart to request refills or for acute  issues that need immediate attention.   Today Lamictal dose increased from daily to 2 times daily. No changes in lexapro.  Please be sure medication list is accurate. If a new problem present, please set up appointment sooner than planned today.

## 2020-10-09 NOTE — Patient Instructions (Addendum)
A few things to remember from today's visit:   Moderate episode of recurrent major depressive disorder (HCC)  Mood disorder (HCC) - Plan: lamoTRIgine (LAMICTAL) 25 MG tablet  If you need refills please call your pharmacy. Do not use My Chart to request refills or for acute issues that need immediate attention.   Today Lamictal dose increased from daily to 2 times daily. No changes in lexapro.  Please be sure medication list is accurate. If a new problem present, please set up appointment sooner than planned today.

## 2020-10-11 ENCOUNTER — Ambulatory Visit (INDEPENDENT_AMBULATORY_CARE_PROVIDER_SITE_OTHER): Payer: 59 | Admitting: Psychology

## 2020-10-11 DIAGNOSIS — F330 Major depressive disorder, recurrent, mild: Secondary | ICD-10-CM

## 2020-10-19 ENCOUNTER — Ambulatory Visit (INDEPENDENT_AMBULATORY_CARE_PROVIDER_SITE_OTHER): Payer: 59 | Admitting: Psychology

## 2020-10-19 DIAGNOSIS — F331 Major depressive disorder, recurrent, moderate: Secondary | ICD-10-CM

## 2020-10-25 ENCOUNTER — Ambulatory Visit (INDEPENDENT_AMBULATORY_CARE_PROVIDER_SITE_OTHER): Payer: 59 | Admitting: Psychology

## 2020-10-25 DIAGNOSIS — F330 Major depressive disorder, recurrent, mild: Secondary | ICD-10-CM | POA: Diagnosis not present

## 2020-11-01 ENCOUNTER — Ambulatory Visit: Payer: 59 | Admitting: Psychology

## 2020-11-08 ENCOUNTER — Ambulatory Visit: Payer: 59 | Admitting: Psychology

## 2020-11-15 ENCOUNTER — Ambulatory Visit: Payer: 59 | Admitting: Psychology

## 2020-11-30 ENCOUNTER — Encounter: Payer: Self-pay | Admitting: Family Medicine

## 2020-12-06 ENCOUNTER — Ambulatory Visit: Payer: 59 | Admitting: Psychology

## 2020-12-08 ENCOUNTER — Other Ambulatory Visit: Payer: Self-pay | Admitting: Family Medicine

## 2020-12-08 DIAGNOSIS — F331 Major depressive disorder, recurrent, moderate: Secondary | ICD-10-CM

## 2020-12-20 ENCOUNTER — Ambulatory Visit: Payer: 59 | Admitting: Psychology

## 2021-01-03 ENCOUNTER — Ambulatory Visit: Payer: 59 | Admitting: Psychology

## 2021-01-09 ENCOUNTER — Ambulatory Visit: Payer: 59 | Admitting: Family Medicine

## 2021-01-24 ENCOUNTER — Ambulatory Visit: Payer: 59 | Admitting: Family Medicine

## 2021-01-24 IMAGING — MG DIGITAL SCREENING BILAT W/ TOMO W/ CAD
8 series · 9 of 24 positions shown · non-contrast
Comparison: None.

CLINICAL DATA: Screening.

EXAM:
DIGITAL SCREENING BILATERAL MAMMOGRAM WITH TOMO AND CAD

[R CC synth-2D]
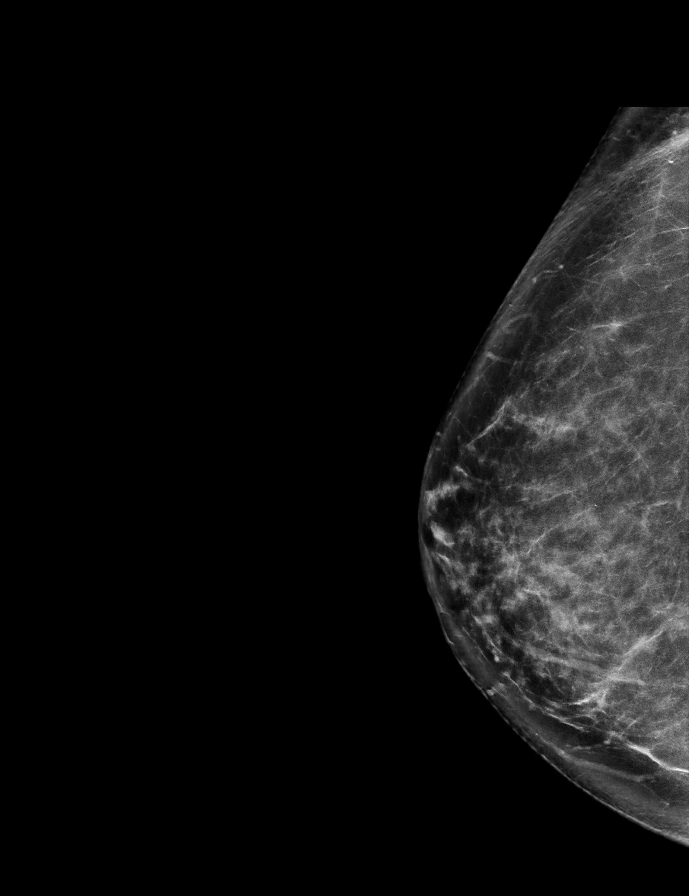

[L CC synth-2D]
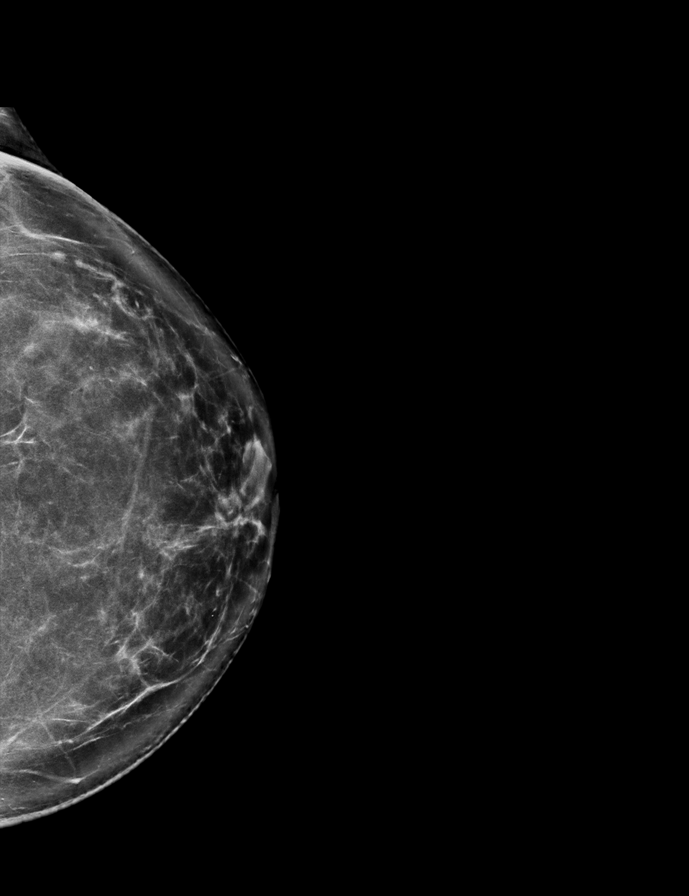

[L MLO synth-2D]
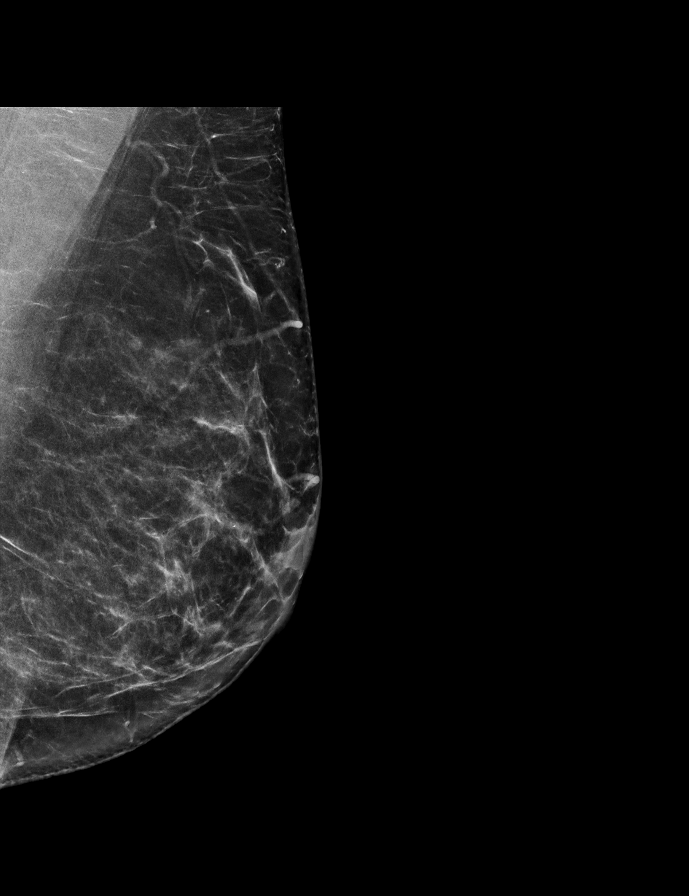

[R MLO synth-2D]
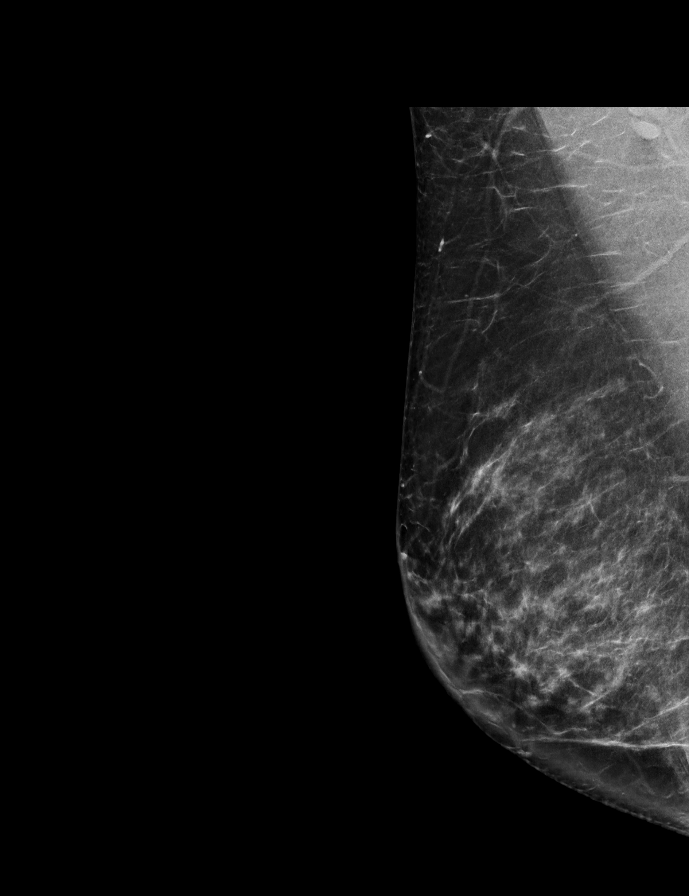

[R CC tomo · 2 of 73 frames shown]
[frame 24/73]
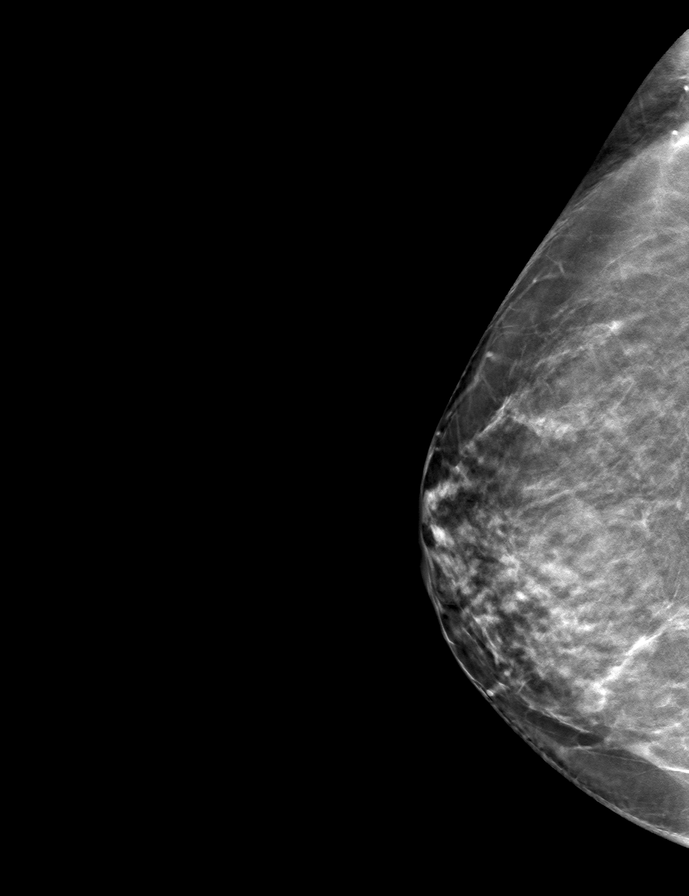
[frame 37/73]
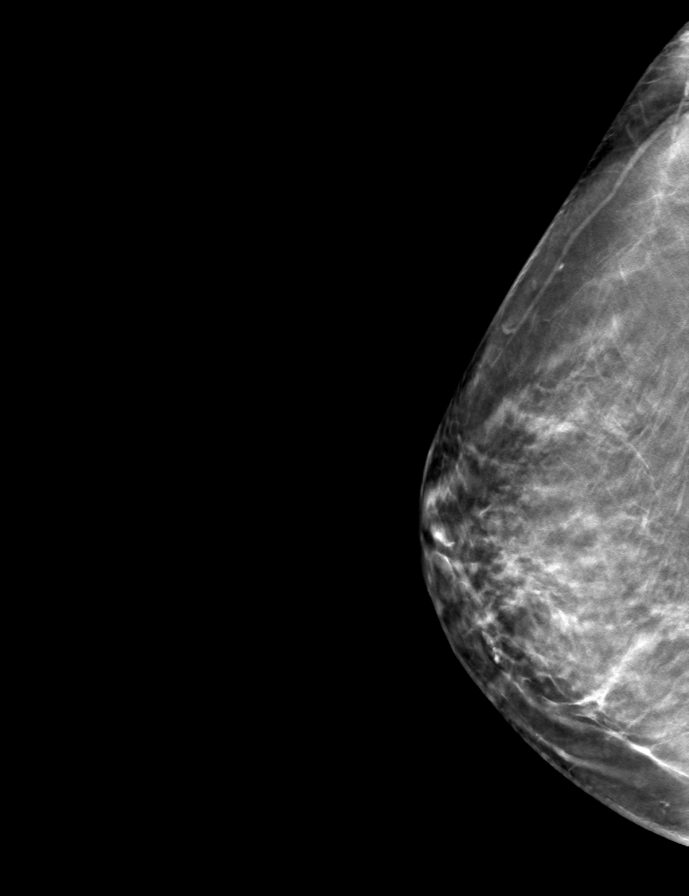

[R MLO tomo · tomo slice 37/72.0]
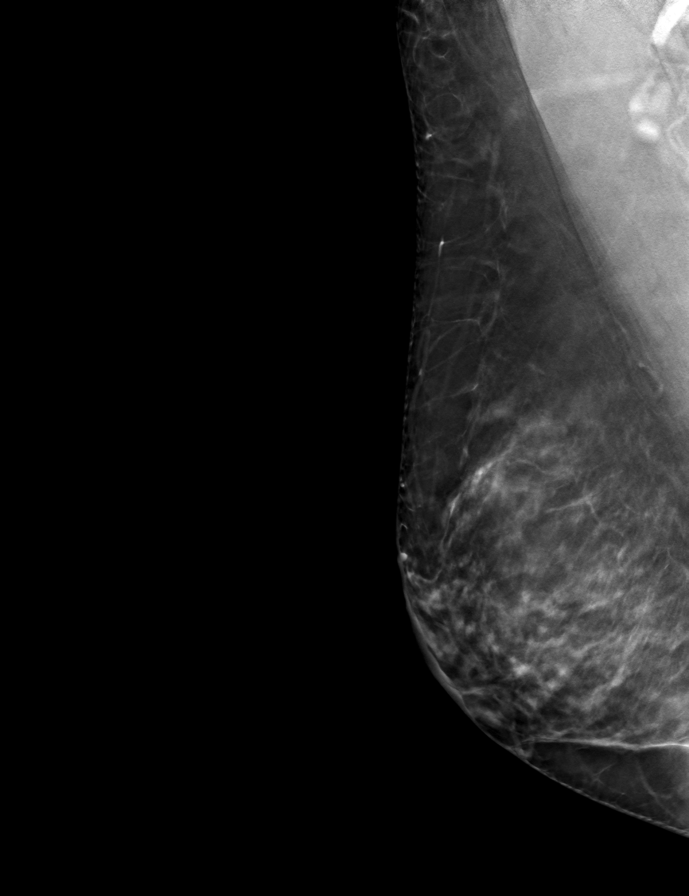

[L MLO tomo · tomo slice 37/73.0]
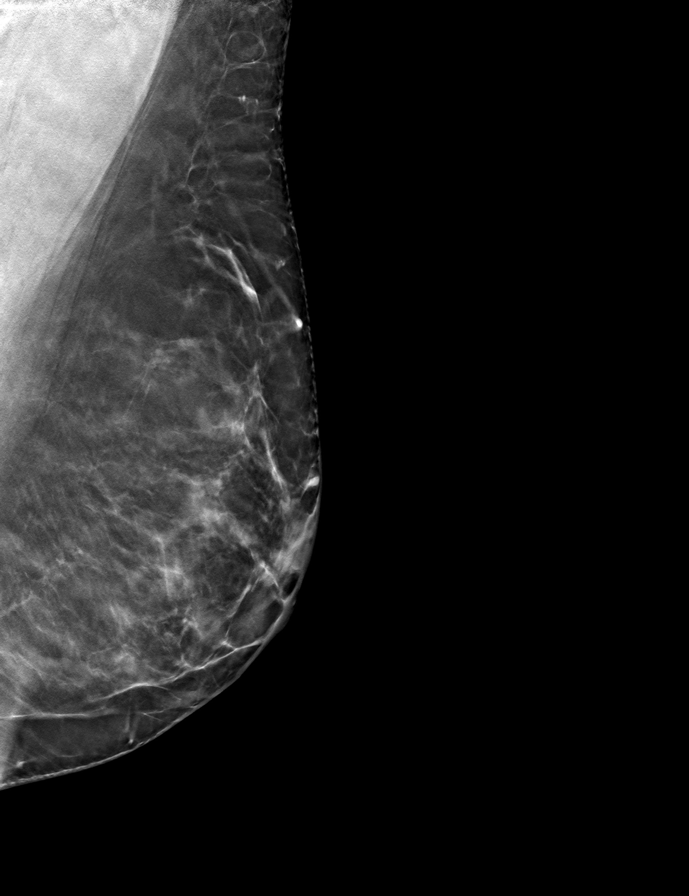

[L CC tomo · tomo slice 39/77.0]
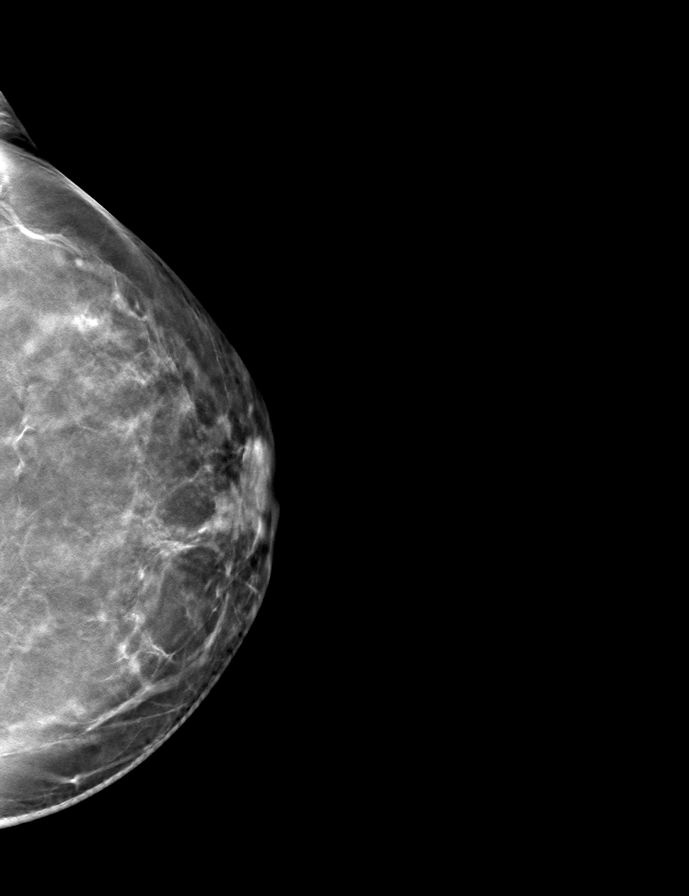

[9 of 24 positions shown; findings below may reference images not displayed]

ACR Breast Density Category c: The breast tissue is heterogeneously
dense, which may obscure small masses
FINDINGS: There are no findings suspicious for malignancy. Images were
processed with CAD.
IMPRESSION: No mammographic evidence of malignancy. A result letter of this
screening mammogram will be mailed directly to the patient.

RECOMMENDATION:
Screening mammogram in one year. (Code:EM-[id number])

BI-RADS CATEGORY  1: Negative.

## 2021-02-13 ENCOUNTER — Ambulatory Visit: Payer: 59 | Admitting: Family Medicine

## 2021-03-20 ENCOUNTER — Ambulatory Visit: Payer: 59 | Admitting: Family Medicine

## 2021-04-09 ENCOUNTER — Encounter: Payer: Self-pay | Admitting: Family Medicine

## 2021-04-09 ENCOUNTER — Other Ambulatory Visit: Payer: Self-pay | Admitting: Family Medicine

## 2021-04-09 DIAGNOSIS — F39 Unspecified mood [affective] disorder: Secondary | ICD-10-CM

## 2021-05-07 ENCOUNTER — Ambulatory Visit: Payer: 59 | Admitting: Family Medicine

## 2021-07-31 NOTE — Progress Notes (Signed)
? ?HPI: ?[name] is a 46 y.o. female, who is here today for follow up. ?She was last seen on 10/09/20. ?Depression, anxiety,and mood disorder: She is on Lamictal 25 mg bid and Lexapro 10 mg daily. ?She has noted improvement of symptoms with medication but wonders of dose can be increased. ?She alternates between feeling extremely happy and motivated to depressed and feeling like she is better off dead. She denies having a suicidal plan or ideation. ? ?Depressed mood and anxiety exacerbated by taking care of her mother in law; who has hx of dementia, spends time with her sister in law and with her every few months.It is becoming more challenging, she needs 24/7 supervision. ? ?Depression screen [facility] 2/9 08/01/2021 10/09/2020 09/06/2020 01/14/2020 09/07/2015  ?Decreased Interest 2 1 3 1 2   ?Down, Depressed, Hopeless 2 2 3 2 1   ?PHQ - 2 Score 4 3 6 3 3   ?Altered sleeping 3 1 2 3 2   ?Tired, decreased energy 3 2 3 3 3   ?Change in appetite 3 3 3 3 2   ?Feeling bad or failure about yourself  2 2 3 2  0  ?Trouble concentrating 2 2 2 3 1   ?Moving slowly or fidgety/restless 1 0 1 3 1   ?Suicidal thoughts 1 0 3 3 0  ?PHQ-9 Score 19 13 23 23 12   ?Difficult doing work/chores Very difficult Somewhat difficult Somewhat difficult Somewhat difficult Somewhat difficult  ? ?GAD 7 : Generalized Anxiety Score 08/01/2021 10/09/2020 09/06/2020 01/16/2020  ?Nervous, Anxious, on Edge 1 0 3 1  ?Control/stop worrying 3 0 3 1  ?Worry too much - different things 3 1 3 2   ?Trouble relaxing 2 1 3 3   ?Restless 1 0 3 3  ?Easily annoyed or irritable 3 0 3 3  ?Afraid - awful might happen 2 1 3 1   ?Total GAD 7 Score 15 3 21 14   ?Anxiety Difficulty Somewhat difficult Somewhat difficult Somewhat difficult Somewhat difficult  ? ?She was on CBT, which helped. ?Sleeping about 4-5 hours. ?+ Fatigue. ?She would like TSH check. ? ? ? ?Hot flashes, seating at night. ?Regular menses, decreased flow. Last once she noted a blood clots.Problem has been going on for  a few months, intermittent. ? ?HLD: She is on non pharmacologic treatment. ?Last FLP 01/14/20: TC 171, HDL 47, LDL 103, TG 111. ?She is eating once per day, at night, generous portion. ?She is not exercising regularly. ? ?Review of Systems  ?Constitutional:  Positive for fatigue. Negative for activity change, appetite change and fever.  ?HENT:  Negative for mouth sores, nosebleeds and trouble swallowing.   ?Respiratory:  Negative for cough, shortness of breath and wheezing.   ?Cardiovascular:  Negative for chest pain, palpitations and leg swelling.  ?Gastrointestinal:  Negative for abdominal pain, nausea and vomiting.  ?     Negative for changes in bowel habits.  ?Endocrine: Negative for cold intolerance and heat intolerance.  ?Skin:  Negative for rash.  ?Neurological:  Negative for syncope, weakness and headaches.  ?Psychiatric/Behavioral:  Positive for decreased concentration and sleep disturbance. Negative for confusion and hallucinations. The patient is nervous/anxious.   ?Rest see pertinent positives and negatives per HPI. ? ?No current outpatient medications on file prior to visit.  ? ?No current facility-administered medications on file prior to visit.  ? ?Past Medical History:  ?Diagnosis Date  ? Anxiety   ? Depression   ? Hyperlipidemia   ? ?No Known Allergies ? ?Social History  ? ?Socioeconomic History  ?  Marital status: Married  ?  Spouse name: Not on file  ? Number of children: Not on file  ? Years of education: Not on file  ? Highest education level: GED or equivalent  ?Occupational History  ? Not on file  ?Tobacco Use  ? Smoking status: Never  ? Smokeless tobacco: Former  ?  Types: Chew  ?Substance and Sexual Activity  ? Alcohol use: Yes  ?  Alcohol/week: 0.0 standard drinks  ? Drug use: No  ? Sexual activity: Yes  ?  Partners: Male  ?Other Topics Concern  ? Not on file  ?Social History Narrative  ? Not on file  ? ?Social Determinants of Health  ? ?Financial Resource Strain: Low Risk   ? Difficulty of  Paying Living Expenses: Not very hard  ?Food Insecurity: No Food Insecurity  ? Worried About Programme researcher, broadcasting/film/video in the Last Year: Never true  ? Ran Out of Food in the Last Year: Never true  ?Transportation Needs: No Transportation Needs  ? Lack of Transportation (Medical): No  ? Lack of Transportation (Non-Medical): No  ?Physical Activity: Unknown  ? Days of Exercise per Week: 0 days  ? Minutes of Exercise per Session: Not on file  ?Stress: Stress Concern Present  ? Feeling of Stress : Very much  ?Social Connections: Moderately Isolated  ? Frequency of Communication with Friends and Family: Once a week  ? Frequency of Social Gatherings with Friends and Family: Never  ? Attends Religious Services: 1 to 4 times per year  ? Active Member of Clubs or Organizations: No  ? Attends Banker Meetings: Not on file  ? Marital Status: Married  ? ?Vitals:  ? 08/01/21 0713  ?BP: 128/80  ?Pulse: 76  ?Resp: 16  ?SpO2: 98%  ? ?Body mass index is 28.36 kg/m?. ? ?Physical Exam ?Vitals and nursing note reviewed.  ?Constitutional:   ?   General: She is not in acute distress. ?   Appearance: She is well-developed.  ?HENT:  ?   Head: Normocephalic and atraumatic.  ?   Mouth/Throat:  ?   Mouth: Mucous membranes are moist.  ?   Pharynx: Oropharynx is clear.  ?Eyes:  ?   Conjunctiva/sclera: Conjunctivae normal.  ?Cardiovascular:  ?   Rate and Rhythm: Normal rate and regular rhythm.  ?   Heart sounds: No murmur heard. ?Pulmonary:  ?   Effort: Pulmonary effort is normal. No respiratory distress.  ?   Breath sounds: Normal breath sounds.  ?Abdominal:  ?   Palpations: Abdomen is soft. There is no hepatomegaly or mass.  ?   Tenderness: There is no abdominal tenderness.  ?Lymphadenopathy:  ?   Cervical: No cervical adenopathy.  ?Skin: ?   General: Skin is warm.  ?   Findings: No erythema or rash.  ?Neurological:  ?   General: No focal deficit present.  ?   Mental Status: She is alert and oriented to person, place, and time.  ?    Cranial Nerves: No cranial nerve deficit.  ?   Gait: Gait normal.  ?Psychiatric:     ?   Mood and Affect: Mood is anxious and depressed.     ?   Thought Content: Thought content does not include suicidal ideation. Thought content does not include suicidal plan.  ? ?ASSESSMENT AND PLAN: ? ?Ms.Ltanya was seen today for follow-up. ? ?Diagnoses and all orders for this visit: ?Orders Placed This Encounter  ?Procedures  ? Basic metabolic panel  ?  TSH  ? Lipid panel  ? ?Lab Results  ?Component Value Date  ? CHOL 176 08/01/2021  ? HDL 38.90 (L) 08/01/2021  ? LDLCALC 101 (H) 08/01/2021  ? LDLDIRECT 144.0 10/01/2017  ? TRIG 178.0 (H) 08/01/2021  ? CHOLHDL 5 08/01/2021  ? ?Lab Results  ?Component Value Date  ? TSH 1.65 08/01/2021  ? ?Lab Results  ?Component Value Date  ? CREATININE 0.77 08/01/2021  ? BUN 18 08/01/2021  ? NA 136 08/01/2021  ? K 4.0 08/01/2021  ? CL 104 08/01/2021  ? CO2 26 08/01/2021  ? ?The 10-year ASCVD risk score (Arnett DK, et al., 2019) is: 1% ?  Values used to calculate the score: ?    Age: 38 years ?    Sex: Female ?    Is Non-Hispanic African American: No ?    Diabetic: No ?    Tobacco smoker: No ?    Systolic Blood Pressure: 128 mmHg ?    Is BP treated: No ?    HDL Cholesterol: 38.9 mg/dL ?    Total Cholesterol: 176 mg/dL ? ?Hot flashes ?We discussed possible etiologies. ?? Perimenopausal. ?We discussed non pharmacologic options, Paroxetine and Effexor, she would like to try either one. ?Further recommendations according to lab results. ? ?Hyperlipidemia ?Non pharmacologic treatment recommended for now. ?Further recommendations will be given according to 10 years CVD risk score and lipid panel numbers. ? ?Moderate episode of recurrent major depressive disorder (HCC) ?Problem has improved but still not well controlled. ?Because she is reporting hot flashes, she would like to try Paroxetine. ?Will start weaning off Lexapro, decreased to 1/2 tab daily for 7 days then every other day x 7 days and  stop. ?She will start Paroxetine 20 mg 1/2 tab daily and can increase to the whole tab in 2 weeks if well tolerated. ?Instructed about warning signs. ? ?Mood disorder (HCC) ?Improved. ?Lamictal dose increased fro

## 2021-08-01 ENCOUNTER — Ambulatory Visit: Payer: 59 | Admitting: Family Medicine

## 2021-08-01 ENCOUNTER — Encounter: Payer: Self-pay | Admitting: Family Medicine

## 2021-08-01 VITALS — BP 128/80 | HR 76 | Resp 16 | Ht 65.75 in | Wt 174.4 lb

## 2021-08-01 DIAGNOSIS — E785 Hyperlipidemia, unspecified: Secondary | ICD-10-CM

## 2021-08-01 DIAGNOSIS — F331 Major depressive disorder, recurrent, moderate: Secondary | ICD-10-CM

## 2021-08-01 DIAGNOSIS — F39 Unspecified mood [affective] disorder: Secondary | ICD-10-CM | POA: Diagnosis not present

## 2021-08-01 DIAGNOSIS — F419 Anxiety disorder, unspecified: Secondary | ICD-10-CM

## 2021-08-01 DIAGNOSIS — R232 Flushing: Secondary | ICD-10-CM

## 2021-08-01 LAB — LIPID PANEL
Cholesterol: 176 mg/dL (ref 0–200)
HDL: 38.9 mg/dL — ABNORMAL LOW (ref >39.00)
LDL Cholesterol: 101 mg/dL — ABNORMAL HIGH (ref 0–99)
NonHDL: 136.98
Total CHOL/HDL Ratio: 5
Triglycerides: 178 mg/dL — ABNORMAL HIGH (ref 0.0–149.0)
VLDL: 35.6 mg/dL (ref 0.0–40.0)

## 2021-08-01 LAB — BASIC METABOLIC PANEL
BUN: 18 mg/dL (ref 6–23)
CO2: 26 mEq/L (ref 19–32)
Calcium: 9.2 mg/dL (ref 8.4–10.5)
Chloride: 104 mEq/L (ref 96–112)
Creatinine, Ser: 0.77 mg/dL (ref 0.40–1.20)
GFR: 94.53 mL/min (ref >60.00)
Glucose, Bld: 88 mg/dL (ref 70–99)
Potassium: 4 mEq/L (ref 3.5–5.1)
Sodium: 136 mEq/L (ref 135–145)

## 2021-08-01 LAB — TSH: TSH: 1.65 u[IU]/mL (ref 0.35–5.50)

## 2021-08-01 MED ORDER — LAMOTRIGINE 100 MG PO TABS: 50.0000 mg | ORAL_TABLET | Freq: Two times a day (BID) | ORAL | 0 refills | Status: DC

## 2021-08-01 MED ORDER — PAROXETINE HCL 20 MG PO TABS: 20.0000 mg | ORAL_TABLET | Freq: Every day | ORAL | 1 refills | Status: DC

## 2021-08-01 MED ORDER — ESCITALOPRAM OXALATE 10 MG PO TABS: ORAL_TABLET | ORAL | 1 refills | Status: DC

## 2021-08-01 NOTE — Patient Instructions (Addendum)
A few things to remember from today's visit: ? ?Moderate episode of recurrent major depressive disorder (HCC) - Plan: PARoxetine (PAXIL) 20 MG tablet, escitalopram (LEXAPRO) 10 MG tablet ? ?Mood disorder (HCC) - Plan: lamoTRIgine (LAMICTAL) 100 MG tablet ? ?Anxiety disorder, unspecified type ? ?Hyperlipidemia, unspecified hyperlipidemia type - Plan: Basic metabolic panel, Lipid panel, Lipid panel, Basic metabolic panel ? ?Fatigue, unspecified type - Plan: Basic metabolic panel, TSH, TSH, Basic metabolic panel ? ?Hot flashes - Plan: PARoxetine (PAXIL) 20 MG tablet ? ?If you need refills please call your pharmacy. ?Do not use My Chart to request refills or for acute issues that need immediate attention. ?  ?Today we increased Lamictal from 25 mg 2 times daily to 100 mg tab 1/2 tab 2 times daily. ?Lexapro to be weaned off, 1/2 ta daily for 7 days and then every other day for 7 days. Start Paroxetine 20 mg 1/2 tab and increase to 20 mg in 2-3 weeks. ? ?Please be sure medication list is accurate. ?If a new problem present, please set up appointment sooner than planned today. ? ? ? ? ? ? ? ?

## 2021-08-01 NOTE — Assessment & Plan Note (Signed)
Non pharmacologic treatment recommended for now. Further recommendations will be given according to 10 years CVD risk score and lipid panel numbers. 

## 2021-08-01 NOTE — Assessment & Plan Note (Addendum)
Problem has improved but still not well controlled. ?Because she is reporting hot flashes, she would like to try Paroxetine. ?Will start weaning off Lexapro, decreased to 1/2 tab daily for 7 days then every other day x 7 days and stop. ?She will start Paroxetine 20 mg 1/2 tab daily and can increase to the whole tab in 2 weeks if well tolerated. ?Instructed about warning signs. ?

## 2021-08-01 NOTE — Assessment & Plan Note (Signed)
Problem is not well controlled. ?Paroxetine started today. ?Lexapro to be weaned off. ?Recommend CBT. ?

## 2021-08-01 NOTE — Assessment & Plan Note (Addendum)
Improved. ?Lamictal dose increased from 25 mg bid to 50 mg bid.  ?We discussed side effects. ?Lexapro to be weaned off and Paroxetine started. ?

## 2021-08-03 ENCOUNTER — Ambulatory Visit: Payer: 59 | Admitting: Family Medicine

## 2021-08-17 ENCOUNTER — Ambulatory Visit: Payer: 59 | Admitting: Family Medicine

## 2021-09-19 ENCOUNTER — Encounter: Payer: Self-pay | Admitting: Family Medicine

## 2021-09-19 ENCOUNTER — Telehealth (INDEPENDENT_AMBULATORY_CARE_PROVIDER_SITE_OTHER): Payer: 59 | Admitting: Family Medicine

## 2021-09-19 ENCOUNTER — Telehealth: Payer: Self-pay | Admitting: Family Medicine

## 2021-09-19 VITALS — Ht 65.75 in

## 2021-09-19 DIAGNOSIS — F331 Major depressive disorder, recurrent, moderate: Secondary | ICD-10-CM

## 2021-09-19 DIAGNOSIS — F419 Anxiety disorder, unspecified: Secondary | ICD-10-CM

## 2021-09-19 DIAGNOSIS — F39 Unspecified mood [affective] disorder: Secondary | ICD-10-CM

## 2021-09-19 MED ORDER — LAMOTRIGINE 100 MG PO TABS: 100.0000 mg | ORAL_TABLET | Freq: Every day | ORAL | 1 refills | Status: DC

## 2021-09-19 MED ORDER — ALPRAZOLAM 0.25 MG PO TABS: 0.2500 mg | ORAL_TABLET | Freq: Every day | ORAL | 0 refills | Status: DC | PRN

## 2021-09-19 NOTE — Assessment & Plan Note (Signed)
Stable. 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 ?Recommend trying Xanax 0.25 mg daily as needed.  We discussed some side effects, recommend trying before her mother-in-law arrives, so she becomes familiar with medication effects. ?Continue Paroxetine 20 mg daily. ?CBT recommended. ?

## 2021-09-19 NOTE — Telephone Encounter (Signed)
Left message for patient to call back to schedule follow up  ? ? ? ? ? ?FYI ?

## 2021-09-19 NOTE — Progress Notes (Signed)
Virtual Visit via Video Note ?I connected with Rebekah [name] on 09/19/21 by a video enabled telemedicine application and verified that I am speaking with the correct person using two identifiers. ? Location patient: home ?Location provider:work office ?Persons participating in the virtual visit: patient, provider ? ?I discussed the limitations of evaluation and management by telemedicine and the availability of in person appointments. The patient expressed understanding and agreed to proceed. ? ?Chief Complaint  ?Patient presents with  ? Follow-up  ?  medications  ? ?HPI: ?She was last seen on 08/01/2021, at that time we increased dose of Lamictal from 25 mg twice daily to 50 mg twice daily, will wean off Lexapro, and started paroxetine. ?She is reporting improvement in her mood, denies suicidal thoughts. ?Feeling drowsy/sleepy during the day, she is not sure if it is because she is going late to bed helping her daughter with final exams or if it is the medication. ?She is not sure how many hours she is sleeping, taking naps during the day. ?She has tolerated medication well, denies side effects. ? ?Her mother-in-law is going to stay with her for a few months, she is coming in 10/2021.  She has Alzheimer's disease and taking care of her daily exacerbates anxiety, she would like something to take daily as needed. ? ?ROS: See pertinent positives and negatives per HPI. ? ?Past Medical History:  ?Diagnosis Date  ? Anxiety   ? Depression   ? Hyperlipidemia   ? ?Past Surgical History:  ?Procedure Laterality Date  ? ceserean    ? ?Family History  ?Problem Relation Age of Onset  ? Hypertension Mother   ? Diabetes Father   ? Hyperlipidemia Father   ? Cancer Father   ?     Stomach?  ? Heart disease Father   ? ?Social History  ? ?Socioeconomic History  ? Marital status: Married  ?  Spouse name: Not on file  ? Number of children: Not on file  ? Years of education: Not on file  ? Highest education level: GED or equivalent   ?Occupational History  ? Not on file  ?Tobacco Use  ? Smoking status: Never  ? Smokeless tobacco: Former  ?  Types: Chew  ?Substance and Sexual Activity  ? Alcohol use: Yes  ?  Alcohol/week: 0.0 standard drinks  ? Drug use: No  ? Sexual activity: Yes  ?  Partners: Male  ?Other Topics Concern  ? Not on file  ?Social History Narrative  ? Not on file  ? ?Social Determinants of Health  ? ?Financial Resource Strain: Low Risk   ? Difficulty of Paying Living Expenses: Not very hard  ?Food Insecurity: No Food Insecurity  ? Worried About Charity fundraiser in the Last Year: Never true  ? Ran Out of Food in the Last Year: Never true  ?Transportation Needs: No Transportation Needs  ? Lack of Transportation (Medical): No  ? Lack of Transportation (Non-Medical): No  ?Physical Activity: Unknown  ? Days of Exercise per Week: 0 days  ? Minutes of Exercise per Session: Not on file  ?Stress: Stress Concern Present  ? Feeling of Stress : Very much  ?Social Connections: Moderately Isolated  ? Frequency of Communication with Friends and Family: Once a week  ? Frequency of Social Gatherings with Friends and Family: Never  ? Attends Religious Services: 1 to 4 times per year  ? Active Member of Clubs or Organizations: No  ? Attends Archivist Meetings: Not  on file  ? Marital Status: Married  ?Intimate Partner Violence: Not on file  ? ? ?Current Outpatient Medications:  ?  lamoTRIgine (LAMICTAL) 100 MG tablet, Take 1 tablet (100 mg total) by mouth at bedtime., Disp: 90 tablet, Rfl: 1 ?  PARoxetine (PAXIL) 20 MG tablet, Take 1 tablet (20 mg total) by mouth daily., Disp: 30 tablet, Rfl: 1 ?  escitalopram (LEXAPRO) 10 MG tablet, 1/2 tab daily for 7 days then every other day for 7 days and stop., Disp: 90 tablet, Rfl: 1 ? ?EXAM: ? ?VITALS per patient if applicable:Ht 5' [id number]" (1.67 m)   BMI 28.36 kg/m?  ? ?GENERAL: alert, oriented, appears well and in no acute distress ? ?HEENT: atraumatic, conjunctiva clear, no obvious  abnormalities on inspection. ? ?NECK: normal movements of the head and neck ? ?LUNGS: on inspection no signs of respiratory distress, breathing rate appears normal, no obvious gross SOB, gasping or wheezing ? ?CV: no obvious cyanosis ? ?MS: moves all visible extremities without noticeable abnormality ? ?PSYCH/NEURO: pleasant and cooperative, no obvious depression or anxiety, speech and thought processing grossly intact ? ?ASSESSMENT AND PLAN: ? ?Discussed the following assessment and plan: ? ?Moderate episode of recurrent major depressive disorder ([place]) ?Problem has improved with paroxetine 20 mg daily, no changes. ?Lamictal dose change from 50 mg twice daily to 100 mg at bedtime. ?We will review some side effects. ?Instructed about warning signs. ?Follow-up in 3 months, before if needed. ? ?Anxiety disorder ?Stable. 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 ?Recommend trying Xanax 0.25 mg daily as needed.  We discussed some side effects, recommend trying before her mother-in-law arrives, so she becomes familiar with medication effects. ?Continue Paroxetine 20 mg daily. ?CBT recommended. ? ?Mood disorder ([place]) ?Improved. ?Lamictal is causing drowsiness taking it bid, so change it from 50 mg bid to 100 mg at bedtime. ?Instructed about warning signs. ?F/U in 3 months, before if needed. ? ?We discussed possible serious and likely etiologies, options for evaluation and workup, limitations of telemedicine visit vs in person visit, treatment, treatment risks and precautions. ?The patient was advised to call back or seek an in-person evaluation if the symptoms worsen or if the condition fails to improve as anticipated. ?I discussed the assessment and treatment plan with the patient. The patient was provided an opportunity to ask questions and all were answered. The patient agreed with the plan and demonstrated an understanding of the  instructions. ? ?Return in about 3 months (around 12/20/2021). ?Aggie Douse G. [place], MD ? ?[place]. ?[place] office. ? ? ?

## 2021-09-19 NOTE — Telephone Encounter (Signed)
-----   Message from Betty G [place], MD sent at 09/19/2021 12:18 PM EDT ----- ?3 months f/u, can be virtual. ?Thanks, ?

## 2021-09-19 NOTE — Assessment & Plan Note (Signed)
Improved. ?Lamictal is causing drowsiness taking it bid, so change it from 50 mg bid to 100 mg at bedtime. ?Instructed about warning signs. ?F/U in 3 months, before if needed. ? ?

## 2021-09-19 NOTE — Assessment & Plan Note (Signed)
Problem has improved with paroxetine 20 mg daily, no changes. ?Lamictal dose change from 50 mg twice daily to 100 mg at bedtime. ?We will review some side effects. ?Instructed about warning signs. ?Follow-up in 3 months, before if needed. ?

## 2021-10-30 ENCOUNTER — Other Ambulatory Visit: Payer: Self-pay | Admitting: Family Medicine

## 2021-10-30 DIAGNOSIS — F331 Major depressive disorder, recurrent, moderate: Secondary | ICD-10-CM

## 2021-10-30 DIAGNOSIS — R232 Flushing: Secondary | ICD-10-CM

## 2021-12-19 ENCOUNTER — Telehealth: Payer: 59 | Admitting: Family Medicine

## 2021-12-19 ENCOUNTER — Ambulatory Visit: Payer: 59 | Admitting: Family Medicine

## 2022-01-09 NOTE — Progress Notes (Unsigned)
ACUTE VISIT Chief Complaint  Patient presents with  . Headache  . Dizziness   HPI: [name] is a 46 y.o. female, who is here today complaining of *** HPI  Review of Systems Rest see pertinent positives and negatives per HPI.  Current Outpatient Medications on File Prior to Visit  Medication Sig Dispense Refill  . ALPRAZolam (XANAX) 0.25 MG tablet Take 1 tablet (0.25 mg total) by mouth daily as needed for anxiety. 20 tablet 0  . lamoTRIgine (LAMICTAL) 100 MG tablet Take 1 tablet (100 mg total) by mouth at bedtime. 90 tablet 1  . PARoxetine (PAXIL) 20 MG tablet Take 1 tablet by mouth once daily 30 tablet 3   No current facility-administered medications on file prior to visit.     Past Medical History:  Diagnosis Date  . Anxiety   . Depression   . Hyperlipidemia    No Known Allergies  Social History   Socioeconomic History  . Marital status: Married    Spouse name: Not on file  . Number of children: Not on file  . Years of education: Not on file  . Highest education level: GED or equivalent  Occupational History  . Not on file  Tobacco Use  . Smoking status: Never  . Smokeless tobacco: Former    Types: Chew  Substance and Sexual Activity  . Alcohol use: Yes    Alcohol/week: 0.0 standard drinks of alcohol  . Drug use: No  . Sexual activity: Yes    Partners: Male  Other Topics Concern  . Not on file  Social History Narrative  . Not on file   Social Determinants of Health   Financial Resource Strain: Low Risk  (07/30/2021)   Overall Financial Resource Strain (CARDIA)   . Difficulty of Paying Living Expenses: Not very hard  Food Insecurity: No Food Insecurity (07/30/2021)   Hunger Vital Sign   . Worried About Programme researcher, broadcasting/film/video in the Last Year: Never true   . Ran Out of Food in the Last Year: Never true  Transportation Needs: No Transportation Needs (07/30/2021)   PRAPARE - Transportation   . Lack of Transportation (Medical): No   . Lack of  Transportation (Non-Medical): No  Physical Activity: Unknown (07/30/2021)   Exercise Vital Sign   . Days of Exercise per Week: 0 days   . Minutes of Exercise per Session: Not on file  Stress: Stress Concern Present (07/30/2021)   [employer] of Occupational Health - Occupational Stress Questionnaire   . Feeling of Stress : Very much  Social Connections: Moderately Isolated (07/30/2021)   Social Connection and Isolation Panel [NHANES]   . Frequency of Communication with Friends and Family: Once a week   . Frequency of Social Gatherings with Friends and Family: Never   . Attends Religious Services: 1 to 4 times per year   . Active Member of Clubs or Organizations: No   . Attends Banker Meetings: Not on file   . Marital Status: Married    Vitals:   01/11/22 1521  BP: 128/80  Pulse: 82  Temp: 99 F (37.2 C)  SpO2: 97%   Body mass index is 30.09 kg/m.  Physical Exam Vitals and nursing note reviewed.  Constitutional:      General: She is not in acute distress.    Appearance: She is well-developed.  HENT:     Head: Normocephalic and atraumatic.     Mouth/Throat:     Mouth: Mucous membranes  are moist.     Pharynx: Oropharynx is clear.  Eyes:     Conjunctiva/sclera: Conjunctivae normal.     Funduscopic exam:    Right eye: No hemorrhage, exudate or papilledema.        Left eye: No hemorrhage, exudate or papilledema.  Cardiovascular:     Rate and Rhythm: Normal rate and regular rhythm.     Pulses:          Dorsalis pedis pulses are 2+ on the right side and 2+ on the left side.     Heart sounds: No murmur heard. Pulmonary:     Effort: Pulmonary effort is normal. No respiratory distress.     Breath sounds: Normal breath sounds.  Abdominal:     Palpations: Abdomen is soft. There is no hepatomegaly or mass.     Tenderness: There is no abdominal tenderness.  Lymphadenopathy:     Cervical: No cervical adenopathy.  Skin:    General: Skin is warm.      Findings: No erythema or rash.       Neurological:     General: No focal deficit present.     Mental Status: She is alert and oriented to person, place, and time.     Cranial Nerves: No cranial nerve deficit.     Gait: Gait normal.  Psychiatric:     Comments: Well groomed, good eye contact.    ASSESSMENT AND PLAN:  There are no diagnoses linked to this encounter.   No follow-ups on file.   Yaritsa Savarino G. [place], MD  [facility]. [place] office.  Discharge Instructions   None

## 2022-01-11 ENCOUNTER — Encounter: Payer: Self-pay | Admitting: Family Medicine

## 2022-01-11 ENCOUNTER — Ambulatory Visit (INDEPENDENT_AMBULATORY_CARE_PROVIDER_SITE_OTHER): Payer: 59 | Admitting: Family Medicine

## 2022-01-11 VITALS — BP 128/80 | HR 82 | Temp 99.0°F | Resp 12 | Ht 65.75 in | Wt 185.0 lb

## 2022-01-11 DIAGNOSIS — L811 Chloasma: Secondary | ICD-10-CM

## 2022-01-11 DIAGNOSIS — R519 Headache, unspecified: Secondary | ICD-10-CM | POA: Diagnosis not present

## 2022-01-11 DIAGNOSIS — E669 Obesity, unspecified: Secondary | ICD-10-CM

## 2022-01-11 DIAGNOSIS — R030 Elevated blood-pressure reading, without diagnosis of hypertension: Secondary | ICD-10-CM | POA: Diagnosis not present

## 2022-01-11 NOTE — Patient Instructions (Signed)
A few things to remember from today's visit:  Elevated blood pressure reading  Melasma - Plan: Ambulatory referral to Dermatology  Headache, unspecified headache type  If you need refills please call your pharmacy. Do not use My Chart to request refills or for acute issues that need immediate attention.   Continue monitoring blood pressure.  Please be sure medication list is accurate. If a new problem present, please set up appointment sooner than planned today.  Melasma Melasma El melasma es una afeccin de la piel que se caracteriza por zonas de coloracin ms oscura. Suele presentarse como [employer], la frente, el labio superior y el cuello. Estas manchas pueden tener forma de [place]. Las zonas pigmentadas no causan picazn y no estn enrojecidas ni hinchadas. El melasma no es contagioso. Eso significa que no se transmite de [place] persona a [place]. Cules son las causas? Se desconoce la causa de esta afeccin. Sin embargo, ciertos factores pueden desencadenarlo, por ejemplo: La exposicin al sol. Alergias a medicamentos o cosmticos, como maquillaje o cremas faciales. Cambios hormonales. Estas pueden ser consecuencia de lo siguiente: Tomar anticonceptivos. Recibir terapia de reemplazo hormonal. Estar embarazada. Qu incrementa el riesgo? Los siguientes factores pueden hacer que sea ms propenso a Aeronautical engineer afeccin: Ser mujer. El melasma es menos frecuente en los hombres. Tener antecedentes familiares de melasma. Tener piel ms oscura. Vivir en un clima tropical. Cules son los signos o sntomas? El nico signo de esta afeccin son las manchas oscuras o marrones en la piel. Cmo se diagnostica? Esta afeccin se diagnostica en funcin de lo siguiente: Un examen fsico. El mdico le examinar el aspecto fsico de la piel. Posiblemente use una luz especial, llamada lmpara de [place], para observar la piel ms de cerca. Biopsia. Se toma una pequea muestra de  tejido de la piel y se observa con un microscopio. Esto se realiza para garantizar que el melasma no se deba a otra enfermedad cutnea, como cncer. Cmo se trata? No hay cura para esta afeccin. Sin embargo, hay tratamientos que pueden aclarar el color de las manchas ms oscuras. El tratamiento puede incluir: Medicamentos, como cremas blanqueadoras o aclaradoras. Descamaciones faciales con sustancias qumicas. Tratamiento con lser. Dermoabrasin o microdermoabrasin. En estos procedimientos, se utilizan instrumentos finos para raspar y Chief Strategy Officer capa externa de la piel con el fin de generar piel nueva de aspecto saludable. El melasma tambin Printmaker solo con [place]. Siga estas instrucciones en su casa:  Estilo de vida Evite exponerse al sol excesivamente, en especial en las zonas tropicales. Use todos los das una pantalla solar con un factor de proteccin solar (FPS) de 30 o ms. Use un sombrero que le proteja la cara del sol. Use cosmticos especiales para piel sensible. No use cera para retirar el exceso de vello de las zonas donde tiene o tuvo melasma. Indicaciones generales Use o aplquese los medicamentos de venta libre y los recetados solamente como se lo haya indicado el mdico. Cumpla con todas las visitas de seguimiento. Esto es importante. Comunquese con un mdico si: Aparecen nuevos sntomas. Sus sntomas empeoran. Las zonas afectadas de la piel sangran o estn irritadas. Resumen El melasma es una afeccin cutnea que se caracteriza por zonas de coloracin ms oscura que no causan picazn, y no estn enrojecidas ni hinchadas. Se desconoce la causa de esta afeccin. Sin embargo, ciertos factores pueden desencadenarla, por ejemplo, la exposicin al sol, alergias a medicamentos o cosmticos, o cambios hormonales. Los factores de riesgo incluyen ser  mujer, tener antecedentes familiares de melasma, tener piel oscura o vivir en un clima tropical. No hay cura para  esta afeccin. Sin embargo, hay tratamientos que pueden aclarar el color de las manchas ms oscuras. El tratamiento puede incluir medicamentos, descamaciones faciales con sustancias qumicas, tratamiento con lser, dermoabrasin o microdermoabrasin. Esta informacin no tiene Theme park manager el consejo del mdico. Asegrese de hacerle al mdico cualquier pregunta que tenga. Document Revised: 12/07/2019 Document Reviewed: 12/07/2019 Elsevier Patient Education  2023 [employer].

## 2022-01-13 ENCOUNTER — Encounter: Payer: Self-pay | Admitting: Family Medicine

## 2022-02-11 ENCOUNTER — Other Ambulatory Visit: Payer: Self-pay | Admitting: Family Medicine

## 2022-02-11 DIAGNOSIS — R232 Flushing: Secondary | ICD-10-CM

## 2022-02-11 DIAGNOSIS — F331 Major depressive disorder, recurrent, moderate: Secondary | ICD-10-CM

## 2022-03-13 ENCOUNTER — Other Ambulatory Visit: Payer: Self-pay | Admitting: Family Medicine

## 2022-03-13 DIAGNOSIS — F331 Major depressive disorder, recurrent, moderate: Secondary | ICD-10-CM

## 2022-03-13 DIAGNOSIS — R232 Flushing: Secondary | ICD-10-CM

## 2022-04-09 ENCOUNTER — Other Ambulatory Visit: Payer: Self-pay | Admitting: Family Medicine

## 2022-04-09 DIAGNOSIS — F419 Anxiety disorder, unspecified: Secondary | ICD-10-CM

## 2022-04-22 ENCOUNTER — Telehealth (INDEPENDENT_AMBULATORY_CARE_PROVIDER_SITE_OTHER): Payer: 59 | Admitting: Family Medicine

## 2022-04-22 ENCOUNTER — Encounter: Payer: Self-pay | Admitting: Family Medicine

## 2022-04-22 VITALS — Ht 65.75 in

## 2022-04-22 DIAGNOSIS — F39 Unspecified mood [affective] disorder: Secondary | ICD-10-CM | POA: Diagnosis not present

## 2022-04-22 DIAGNOSIS — G4700 Insomnia, unspecified: Secondary | ICD-10-CM | POA: Insufficient documentation

## 2022-04-22 DIAGNOSIS — F419 Anxiety disorder, unspecified: Secondary | ICD-10-CM | POA: Diagnosis not present

## 2022-04-22 MED ORDER — DOXEPIN HCL 10 MG/ML PO CONC: 10.0000 mg | Freq: Every day | ORAL | 12 refills | Status: DC

## 2022-04-22 NOTE — Assessment & Plan Note (Signed)
We discussed possible etiologies. Some of her chronic psychiatric conditions can be contributing factors. We discussed pharmacologic treatment options, because she is already on alprazolam, I prefer to hold on Ambien or similar agents. Doxepin 3 mg at bedtime started today, she was instructed to titrate dose up to 6 mg and 10 mg if needed. Stressed the importance of adequate sleep hygiene. Follow-up in 6 weeks, before if needed.

## 2022-04-22 NOTE — Assessment & Plan Note (Addendum)
Stable. Continue alprazolam 0.25 mg daily as needed, usually when she is taking care of her mother-in-law, who has history of dementia.

## 2022-04-22 NOTE — Progress Notes (Signed)
Virtual Visit via Video Note I connected with Rebekah [name] on 04/22/22 by a video enabled telemedicine application and verified that I am speaking with the correct person using two identifiers. Location patient: home Location provider:work office Persons participating in the virtual visit: patient, provider  I discussed the limitations of evaluation and management by telemedicine and the availability of in person appointments. The patient expressed understanding and agreed to proceed.  Chief Complaint  Patient presents with   Insomnia   HPI: Rebekah [name] is a 46 year old female with history of anxiety, depression, and mood disorder complaining of 2 months of difficulty sleeping. She reports having trouble both falling asleep and staying asleep. She usually falls asleep around 3 AM, wakes up at 5 AM, and occasionally manages to sleep for an additional hour. She sometimes sleeps for a short period in the afternoon. She denies new stressors. She has not identified exacerbating or alleviating factors. She has tried different OTC sleep aids, unsuccessfully.  Mood disorder and depression: She is currently taking Lamictal 100mg  at night and Paroxetine 20 mg daily.  She has noticed an improvement in her mood since starting these medications and feels generally "calm." She denies experiencing any episodes of excessive energy, extreme happiness,hallucinations, or impulsive behaviors. Anxiety: She occasionally takes Alprazolam 0.25 mg, but not frequently.  Problem is exacerbated by being her mother-in-law's caregiver when she is visiting. Negative for fever, chills, body aches, CP, dyspnea, palpitations, abdominal pain, nausea, changes in bowel habits, cold/heat intolerance, or abnormal weight loss.  ROS: See pertinent positives and negatives per HPI.  Past Medical History:  Diagnosis Date   Anxiety    Depression    Hyperlipidemia     Past Surgical History:  Procedure Laterality Date    ceserean     Family History  Problem Relation Age of Onset   Hypertension Mother    Diabetes Father    Hyperlipidemia Father    Cancer Father        Stomach?   Heart disease Father    Social History   Socioeconomic History   Marital status: Married    Spouse name: Not on file   Number of children: Not on file   Years of education: Not on file   Highest education level: GED or equivalent  Occupational History   Not on file  Tobacco Use   Smoking status: Never   Smokeless tobacco: Former    Types: Chew  Substance and Sexual Activity   Alcohol use: Yes    Alcohol/week: 0.0 standard drinks of alcohol   Drug use: No   Sexual activity: Yes    Partners: Male  Other Topics Concern   Not on file  Social History Narrative   Not on file   Social Determinants of Health   Financial Resource Strain: Low Risk  (07/30/2021)   Overall Financial Resource Strain (CARDIA)    Difficulty of Paying Living Expenses: Not very hard  Food Insecurity: No Food Insecurity (07/30/2021)   Hunger Vital Sign    Worried About Running Out of Food in the Last Year: Never true    Ran Out of Food in the Last Year: Never true  Transportation Needs: No Transportation Needs (07/30/2021)   PRAPARE - 08/01/2021 (Medical): No    Lack of Transportation (Non-Medical): No  Physical Activity: Unknown (07/30/2021)   Exercise Vital Sign    Days of Exercise per Week: 0 days    Minutes of Exercise per Session: Not  on file  Stress: Stress Concern Present (07/30/2021)   [employer] of Occupational Health - Occupational Stress Questionnaire    Feeling of Stress : Very much  Social Connections: Moderately Isolated (07/30/2021)   Social Connection and Isolation Panel [NHANES]    Frequency of Communication with Friends and Family: Once a week    Frequency of Social Gatherings with Friends and Family: Never    Attends Religious Services: 1 to 4 times per year    Active Member of [employer]  or Organizations: No    Attends Engineer, structural: Not on file    Marital Status: Married  Catering manager Violence: Not on file    Current Outpatient Medications:    ALPRAZolam (XANAX) 0.25 MG tablet, TAKE 1 TABLET BY MOUTH ONCE DAILY AS NEEDED FOR ANXIETY, Disp: 20 tablet, Rfl: 0   doxepin (SINEQUAN) 10 MG/ML solution, Take 1 mL (10 mg total) by mouth at bedtime., Disp: 30 mL, Rfl: 12   lamoTRIgine (LAMICTAL) 100 MG tablet, Take 1 tablet (100 mg total) by mouth at bedtime., Disp: 90 tablet, Rfl: 1   PARoxetine (PAXIL) 20 MG tablet, Take 1 tablet by mouth once daily, Disp: 30 tablet, Rfl: 3  EXAM:  VITALS per patient if applicable:Ht 5' 5.75" (1.67 m)   BMI 30.09 kg/m   GENERAL: alert, oriented, appears well and in no acute distress  HEENT: atraumatic, conjunctiva clear, no obvious abnormalities on inspection of external nose and ears  NECK: normal movements of the head and neck  LUNGS: on inspection no signs of respiratory distress, breathing rate appears normal, no obvious gross SOB, gasping or wheezing  CV: no obvious cyanosis  MS: moves all visible extremities without noticeable abnormality  PSYCH/NEURO: pleasant and cooperative, no obvious depression or anxiety, speech and thought processing grossly intact  ASSESSMENT AND PLAN:  Discussed the following assessment and plan:  Insomnia, unspecified type - Plan: doxepin (SINEQUAN) 10 MG/ML solution  Mood disorder (HCC)  Anxiety disorder, unspecified type  Insomnia, unspecified type Assessment & Plan: We discussed possible etiologies. Some of her chronic psychiatric conditions can be contributing factors. We discussed pharmacologic treatment options, because she is already on alprazolam, I prefer to hold on Ambien or similar agents. Doxepin 3 mg at bedtime started today, she was instructed to titrate dose up to 6 mg and 10 mg if needed. Stressed the importance of adequate sleep hygiene. Follow-up in 6  weeks, before if needed.  Orders: -     Doxepin HCl; Take 1 mL (10 mg total) by mouth at bedtime.  Dispense: 30 mL; Refill: 12  Mood disorder (HCC) Assessment & Plan: She reports the problem is well controlled, no mood changes reported. For now continue Lamictal 100 mg daily at bedtime.   Anxiety disorder, unspecified type Assessment & Plan: Stable. Continue alprazolam 0.25 mg daily as needed, usually when she is taking care of her mother-in-law, who has history of dementia.    Return in about 6 weeks (around 06/03/2022) for chronic problems.  We discussed possible serious and likely etiologies, options for evaluation and workup, limitations of telemedicine visit vs in person visit, treatment, treatment risks and precautions. The patient was advised to call back or seek an in-person evaluation if the symptoms worsen or if the condition fails to improve as anticipated. I discussed the assessment and treatment plan with the patient. The patient was provided an opportunity to ask questions and all were answered. The patient agreed with the plan and demonstrated an understanding of  the instructions.  Gyanna Jarema G. [place], MD  [facility]. [place] office.

## 2022-04-22 NOTE — Assessment & Plan Note (Signed)
She reports the problem is well controlled, no mood changes reported. For now continue Lamictal 100 mg daily at bedtime.

## 2022-04-23 ENCOUNTER — Telehealth: Payer: Self-pay | Admitting: Family Medicine

## 2022-04-23 NOTE — Telephone Encounter (Signed)
LVM for patient to call and schedule a 6 week follow up that could be in person or virtual.         FYI

## 2022-04-23 NOTE — Telephone Encounter (Signed)
-----   Message from Betty G [place], MD sent at 04/22/2022  1:32 PM EST ----- 6 weeks follow-up, can be virtual visit. Thanks

## 2022-05-24 ENCOUNTER — Other Ambulatory Visit: Payer: Self-pay | Admitting: Family Medicine

## 2022-07-12 ENCOUNTER — Other Ambulatory Visit: Payer: Self-pay | Admitting: Family Medicine

## 2022-07-12 DIAGNOSIS — F331 Major depressive disorder, recurrent, moderate: Secondary | ICD-10-CM

## 2022-07-12 DIAGNOSIS — R232 Flushing: Secondary | ICD-10-CM

## 2022-07-31 ENCOUNTER — Ambulatory Visit: Payer: 59 | Admitting: Family Medicine

## 2022-08-23 ENCOUNTER — Other Ambulatory Visit: Payer: Self-pay | Admitting: Family Medicine

## 2022-08-26 MED ORDER — LAMOTRIGINE 100 MG PO TABS: 100.0000 mg | ORAL_TABLET | Freq: Every day | ORAL | 0 refills | Status: DC

## 2022-11-25 ENCOUNTER — Other Ambulatory Visit: Payer: Self-pay | Admitting: Family Medicine

## 2023-01-02 ENCOUNTER — Encounter (INDEPENDENT_AMBULATORY_CARE_PROVIDER_SITE_OTHER): Payer: Self-pay

## 2023-01-21 NOTE — Progress Notes (Unsigned)
HPI: Rebekah [name] is a 47 y.o. female, who is here today for her routine physical.  Last CPE: ***  ***  Chronic medical problems: ***  Immunization History  Administered Date(s) Administered   PFIZER(Purple Top)SARS-COV-2 Vaccination 08/26/2019, 09/20/2019, 06/03/2020   Health Maintenance  Topic Date Due   HIV Screening  Never done   DTaP/Tdap/Td (1 - Tdap) Never done   PAP SMEAR-Modifier  10/02/2022   INFLUENZA VACCINE  Never done   COVID-19 Vaccine (4 - 2023-24 season) 02/07/2023 (Originally 01/19/2023)   Hepatitis C Screening  Completed   HPV VACCINES  Aged Out   10/01/2017 pap smear: ADEQUACY: Satisfactory for evaluation  endocervical/transformation zone component ABSENT. DIAGNOSIS: NEGATIVE FOR INTRAEPITHELIAL LESIONS OR MALIGNANCY. HPV [facility]): NOT DETECTED MATERIAL SUBMITTED: CervicoVaginal Pap [ThinPrep Imaged]  She has *** concerns today. Lab Results  Component Value Date   CHOL 176 08/01/2021   HDL 38.90 (L) 08/01/2021   LDLCALC 101 (H) 08/01/2021   LDLDIRECT 144.0 10/01/2017   TRIG 178.0 (H) 08/01/2021   CHOLHDL 5 08/01/2021   Lab Results  Component Value Date   HGBA1C 5.3 01/14/2020    Review of Systems  Constitutional:  Negative for activity change, appetite change and fever.  HENT:  Negative for hearing loss, mouth sores, sore throat and trouble swallowing.   Eyes:  Negative for redness and visual disturbance.  Respiratory:  Negative for cough, shortness of breath and wheezing.   Cardiovascular:  Negative for chest pain and leg swelling.  Gastrointestinal:  Negative for abdominal pain, nausea and vomiting.       No changes in bowel habits.  Endocrine: Negative for cold intolerance, heat intolerance, polydipsia, polyphagia and polyuria.  Genitourinary:  Negative for decreased urine volume, dysuria, hematuria, vaginal bleeding and vaginal discharge.  Musculoskeletal:  Negative for gait problem and myalgias.  Skin:  Negative for color  change and rash.  Allergic/Immunologic: Negative for environmental allergies.  Neurological:  Negative for seizures, syncope, weakness and headaches.  Hematological:  Negative for adenopathy. Does not bruise/bleed easily.  Psychiatric/Behavioral:  Negative for confusion. The patient is not nervous/anxious.   All other systems reviewed and are negative.   Current Outpatient Medications on File Prior to Visit  Medication Sig Dispense Refill   lamoTRIgine (LAMICTAL) 100 MG tablet TAKE 1 TABLET BY MOUTH AT BEDTIME 90 tablet 0   PARoxetine (PAXIL) 20 MG tablet Take 1 tablet by mouth once daily 30 tablet 2   No current facility-administered medications on file prior to visit.    Past Medical History:  Diagnosis Date   Anxiety    Depression    Hyperlipidemia     Past Surgical History:  Procedure Laterality Date   ceserean      No Known Allergies  Family History  Problem Relation Age of Onset   Hypertension Mother    Diabetes Father    Hyperlipidemia Father    Cancer Father        Stomach?   Heart disease Father     Social History   Socioeconomic History   Marital status: Married    Spouse name: Not on file   Number of children: Not on file   Years of education: Not on file   Highest education level: GED or equivalent  Occupational History   Not on file  Tobacco Use   Smoking status: Never   Smokeless tobacco: Former    Types: Chew  Substance and Sexual Activity   Alcohol use: Yes    Alcohol/week:  0.0 standard drinks of alcohol   Drug use: No   Sexual activity: Yes    Partners: Male  Other Topics Concern   Not on file  Social History Narrative   Not on file   Social Determinants of Health   Financial Resource Strain: Low Risk  (07/30/2021)   Overall Financial Resource Strain (CARDIA)    Difficulty of Paying Living Expenses: Not very hard  Food Insecurity: No Food Insecurity (07/30/2021)   Hunger Vital Sign    Worried About Running Out of Food in the Last  Year: Never true    Ran Out of Food in the Last Year: Never true  Transportation Needs: No Transportation Needs (07/30/2021)   PRAPARE - Administrator, Civil Service (Medical): No    Lack of Transportation (Non-Medical): No  Physical Activity: Unknown (07/30/2021)   Exercise Vital Sign    Days of Exercise per Week: 0 days    Minutes of Exercise per Session: Not on file  Stress: Stress Concern Present (07/30/2021)   [employer] of Occupational Health - Occupational Stress Questionnaire    Feeling of Stress : Very much  Social Connections: Moderately Isolated (07/30/2021)   Social Connection and Isolation Panel [NHANES]    Frequency of Communication with Friends and Family: Once a week    Frequency of Social Gatherings with Friends and Family: Never    Attends Religious Services: 1 to 4 times per year    Active Member of [employer] or Organizations: No    Attends Banker Meetings: Not on file    Marital Status: Married    Vitals:   01/22/23 0841  BP: 110/70  Pulse: 70  Resp: 12  Temp: 98.8 F (37.1 C)  SpO2: 96%   Body mass index is 28.48 kg/m.  Wt Readings from Last 3 Encounters:  01/22/23 175 lb 2 oz (79.4 kg)  01/11/22 185 lb (83.9 kg)  08/01/21 174 lb 6 oz (79.1 kg)    Physical Exam Vitals and nursing note reviewed. Exam conducted with a chaperone present.  Constitutional:      General: She is not in acute distress.    Appearance: She is well-developed.  HENT:     Head: Normocephalic and atraumatic.     Right Ear: Hearing, tympanic membrane, ear canal and external ear normal.     Left Ear: Hearing, tympanic membrane, ear canal and external ear normal.     Mouth/Throat:     Mouth: Mucous membranes are moist.     Pharynx: Oropharynx is clear. Uvula midline.  Eyes:     Extraocular Movements: Extraocular movements intact.     Conjunctiva/sclera: Conjunctivae normal.     Pupils: Pupils are equal, round, and reactive to light.  Neck:      Thyroid: No thyromegaly.     Trachea: No tracheal deviation.  Cardiovascular:     Rate and Rhythm: Normal rate and regular rhythm.     Pulses:          Dorsalis pedis pulses are 2+ on the right side and 2+ on the left side.       Posterior tibial pulses are 2+ on the right side and 2+ on the left side.     Heart sounds: No murmur heard. Pulmonary:     Effort: Pulmonary effort is normal. No respiratory distress.     Breath sounds: Normal breath sounds.  Abdominal:     Palpations: Abdomen is soft. There is no hepatomegaly or mass.  Tenderness: There is no abdominal tenderness.  Genitourinary:    Exam position: Lithotomy position.     Labia:        Right: No rash, tenderness or lesion.        Left: No rash, tenderness or lesion.      Vagina: No signs of injury and foreign body. No vaginal discharge, erythema, tenderness, bleeding or lesions.     Cervix: No cervical motion tenderness, discharge, friability or erythema.     Uterus: Not enlarged and not tender.      Adnexa:        Right: No mass, tenderness or fullness.         Left: No mass, tenderness or fullness.            Comments: Hyperpigmented macula. *** Musculoskeletal:     Right lower leg: No edema.     Left lower leg: No edema.     Comments: No major deformity or signs of synovitis appreciated.  Lymphadenopathy:     Cervical: No cervical adenopathy.     Upper Body:     Right upper body: No supraclavicular adenopathy.     Left upper body: No supraclavicular adenopathy.  Skin:    General: Skin is warm.     Findings: No erythema or rash.  Neurological:     General: No focal deficit present.     Mental Status: She is alert and oriented to person, place, and time.     Cranial Nerves: No cranial nerve deficit.     Coordination: Coordination normal.     Gait: Gait normal.     Deep Tendon Reflexes:     Reflex Scores:      Bicep reflexes are 2+ on the right side and 2+ on the left side.      Patellar reflexes are  2+ on the right side and 2+ on the left side. Psychiatric:        Speech: Speech normal.     Comments: Well groomed, good eye contact.     ASSESSMENT AND PLAN: Rebekah [name] was here today annual physical examination.  Orders Placed This Encounter  Procedures   MM 3D SCREENING MAMMOGRAM BILATERAL BREAST   Ambulatory referral to Psychiatry    Rebekah [name] was seen today for annual exam.  Diagnoses and all orders for this visit:  Routine general medical examination at a health care facility  Hyperlipidemia, unspecified hyperlipidemia type  Mood disorder (HCC) -     Ambulatory referral to Psychiatry  Moderate episode of recurrent major depressive disorder (HCC) -     Ambulatory referral to Psychiatry  Screening for endocrine, metabolic and immunity disorder  Encounter for screening for malignant neoplasm of breast, unspecified screening modality -     MM 3D SCREENING MAMMOGRAM BILATERAL BREAST; Future  Cervical cancer screening -     PAP [[facility]]    Routine general medical examination at a health care facility  Hyperlipidemia, unspecified hyperlipidemia type  Mood disorder (HCC) -     Ambulatory referral to Psychiatry  Moderate episode of recurrent major depressive disorder (HCC) -     Ambulatory referral to Psychiatry  Screening for endocrine, metabolic and immunity disorder  Encounter for screening for malignant neoplasm of breast, unspecified screening modality -     3D Screening Mammogram, Left and Right; Future  Cervical cancer screening -     Cytology - PAP    Return in 6 months (on 07/22/2023) for chronic problems.  Kathie Rhodes  G. [place], MD  [facility]. [place] office.

## 2023-01-22 ENCOUNTER — Encounter: Payer: Self-pay | Admitting: Family Medicine

## 2023-01-22 ENCOUNTER — Ambulatory Visit (INDEPENDENT_AMBULATORY_CARE_PROVIDER_SITE_OTHER): Payer: 59 | Admitting: Family Medicine

## 2023-01-22 ENCOUNTER — Other Ambulatory Visit (HOSPITAL_COMMUNITY)
Admission: RE | Admit: 2023-01-22 | Discharge: 2023-01-22 | Disposition: A | Discharge status: Home / Self Care | Payer: 59 | Source: Ambulatory Visit | Attending: Family Medicine | Admitting: Family Medicine

## 2023-01-22 VITALS — BP 110/70 | HR 70 | Temp 98.8°F | Resp 12 | Ht 65.75 in | Wt 175.1 lb

## 2023-01-22 DIAGNOSIS — Z23 Encounter for immunization: Secondary | ICD-10-CM

## 2023-01-22 DIAGNOSIS — Z13228 Encounter for screening for other metabolic disorders: Secondary | ICD-10-CM | POA: Diagnosis not present

## 2023-01-22 DIAGNOSIS — Z124 Encounter for screening for malignant neoplasm of cervix: Secondary | ICD-10-CM

## 2023-01-22 DIAGNOSIS — F331 Major depressive disorder, recurrent, moderate: Secondary | ICD-10-CM

## 2023-01-22 DIAGNOSIS — Z1239 Encounter for other screening for malignant neoplasm of breast: Secondary | ICD-10-CM

## 2023-01-22 DIAGNOSIS — E785 Hyperlipidemia, unspecified: Secondary | ICD-10-CM

## 2023-01-22 DIAGNOSIS — Z1329 Encounter for screening for other suspected endocrine disorder: Secondary | ICD-10-CM

## 2023-01-22 DIAGNOSIS — F39 Unspecified mood [affective] disorder: Secondary | ICD-10-CM | POA: Diagnosis not present

## 2023-01-22 DIAGNOSIS — Z0000 Encounter for general adult medical examination without abnormal findings: Secondary | ICD-10-CM | POA: Diagnosis not present

## 2023-01-22 DIAGNOSIS — Z130 Encounter for screening for diseases of the blood and blood-forming organs and certain disorders involving the immune mechanism: Secondary | ICD-10-CM | POA: Diagnosis not present

## 2023-01-22 LAB — LIPID PANEL
Cholesterol: 184 mg/dL (ref 0–200)
HDL: 36.7 mg/dL — ABNORMAL LOW (ref >39.00)
LDL Cholesterol: 119 mg/dL — ABNORMAL HIGH (ref 0–99)
NonHDL: 147.13
Total CHOL/HDL Ratio: 5
Triglycerides: 140 mg/dL (ref 0.0–149.0)
VLDL: 28 mg/dL (ref 0.0–40.0)

## 2023-01-22 LAB — BASIC METABOLIC PANEL
BUN: 13 mg/dL (ref 6–23)
CO2: 26 meq/L (ref 19–32)
Calcium: 9.2 mg/dL (ref 8.4–10.5)
Chloride: 104 meq/L (ref 96–112)
Creatinine, Ser: 0.78 mg/dL (ref 0.40–1.20)
GFR: 92.12 mL/min (ref >60.00)
Glucose, Bld: 72 mg/dL (ref 70–99)
Potassium: 3.9 meq/L (ref 3.5–5.1)
Sodium: 137 meq/L (ref 135–145)

## 2023-01-22 LAB — HEMOGLOBIN A1C: Hgb A1c MFr Bld: 5.7 % (ref 4.6–6.5)

## 2023-01-22 NOTE — Patient Instructions (Addendum)
A few things to remember from today's visit:  Routine general medical examination at a health care facility  Hyperlipidemia, unspecified hyperlipidemia type - Plan: Lipid panel  Mood disorder (HCC), Chronic - Plan: Ambulatory referral to Psychiatry  Moderate episode of recurrent major depressive disorder (HCC), Chronic - Plan: Ambulatory referral to Psychiatry  Screening for endocrine, metabolic and immunity disorder - Plan: Basic metabolic panel, Hemoglobin A1c  Encounter for screening for malignant neoplasm of breast, unspecified screening modality - Plan: MM 3D SCREENING MAMMOGRAM BILATERAL BREAST  Cervical cancer screening - Plan: PAP [[place]]  Llame a pedir una cita con siquiatra.  Do not use My Chart to request refills or for acute issues that need immediate attention. If you send a my chart message, it may take a few days to be addressed, specially if I am not in the office.  Please be sure medication list is accurate. If a new problem present, please set up appointment sooner than planned today.  Mantenimiento de Radiographer, therapeutic en las mujeres Health Maintenance, Female Adoptar un estilo de vida saludable y recibir atencin preventiva son importantes para promover la salud y Counsellor. Consulte al mdico sobre: El esquema adecuado para hacerse pruebas y exmenes peridicos. Cosas que puede hacer por su cuenta para prevenir enfermedades y [place] sano. Qu debo saber sobre la dieta, el peso y el ejercicio? Consuma una dieta saludable  Consuma una dieta que incluya muchas verduras, frutas, productos lcteos con bajo contenido de [place] y Associate Professor. No consuma muchos alimentos ricos en grasas slidas, azcares agregados o sodio. Mantenga un peso saludable El ndice de masa muscular [facility]) se [place] para identificar problemas de [place]. Proporciona una estimacin de la grasa corporal basndose en el peso y la altura. Su mdico puede ayudarle a Engineer, site IMC y a  Personnel officer o Pharmacologist un peso saludable. Haga ejercicio con regularidad Haga ejercicio con regularidad. Esta es una de las prcticas ms importantes que puede hacer por su salud. La [employer] de los adultos deben seguir estas pautas: Education officer, environmental, al menos, 150 minutos de actividad fsica por semana. El ejercicio debe aumentar la frecuencia cardaca y Media planner transpirar (ejercicio de intensidad moderada). Hacer ejercicios de fortalecimiento por lo [facility] [employer] por semana. Agregue esto a su plan de ejercicio de intensidad moderada. Pase menos tiempo sentada. Incluso la actividad fsica ligera puede ser beneficiosa. Controle sus niveles de colesterol y lpidos en la sangre Comience a realizarse anlisis de lpidos y Oncologist en la sangre a los 20 aos y luego reptalos cada 5 aos. Hgase controlar los niveles de colesterol con mayor frecuencia si: Sus niveles de lpidos y colesterol son altos. Es mayor de 40 aos. Presenta un alto riesgo de padecer enfermedades cardacas. Qu debo saber sobre las pruebas de deteccin del cncer? Segn su historia clnica y sus antecedentes familiares, es posible que deba realizarse pruebas de deteccin del cncer en diferentes edades. Esto puede incluir pruebas de deteccin de lo siguiente: Cncer de mama. Cncer de cuello uterino. Cncer colorrectal. Cncer de piel. Cncer de pulmn. Qu debo saber sobre la enfermedad cardaca, la diabetes y la hipertensin arterial? Presin arterial y enfermedad cardaca La hipertensin arterial causa enfermedades cardacas y [place] el riesgo de accidente cerebrovascular. Es ms probable que esto se manifieste en las personas que tienen lecturas de presin arterial alta o tienen sobrepeso. Hgase controlar la presin arterial: Cada 3 a 5 aos si tiene entre 18 y 49 aos. Todos los aos si es mayor de 40 aos.  Diabetes Realcese exmenes de deteccin de la diabetes con regularidad. Este anlisis revisa el nivel de azcar en  la sangre en [place]. Hgase las pruebas de deteccin: Cada tres aos despus de los 40 aos de edad si tiene un peso normal y un bajo riesgo de padecer diabetes. Con ms frecuencia y a partir de [place] edad inferior si tiene sobrepeso o un alto riesgo de padecer diabetes. Qu debo saber sobre la prevencin de infecciones? Hepatitis B Si tiene un riesgo ms alto de contraer hepatitis B, debe someterse a un examen de deteccin de este virus. Hable con el mdico para averiguar si tiene riesgo de contraer la infeccin por hepatitis B. Hepatitis C Se recomienda el anlisis a: [employer] 1945 y 1965. Todas las personas que tengan un riesgo de haber contrado hepatitis C. Enfermedades de transmisin sexual (ETS) Hgase las pruebas de Airline pilot de ITS, incluidas la gonorrea y la clamidia, si: Es sexualmente activa y es menor de [address]. Es mayor de [address], y Public affairs consultant informa que corre riesgo de tener este tipo de infecciones. La actividad sexual ha cambiado desde que le hicieron la ltima prueba de deteccin y tiene un riesgo mayor de Warehouse manager clamidia o Copy. Pregntele al mdico si usted tiene riesgo. Pregntele al mdico si usted tiene un alto riesgo de Primary school teacher VIH. El mdico tambin puede recomendarle un medicamento recetado para ayudar a evitar la infeccin por el VIH. Si elige tomar medicamentos para prevenir el VIH, primero debe [employer] de deteccin del VIH. Luego debe hacerse anlisis cada 3 meses mientras est tomando los medicamentos. Embarazo Si est por dejar de Armed forces training and education officer (fase premenopusica) y usted puede quedar [place], busque asesoramiento antes de [place]. Tome de 400 a 800 microgramos (mcg) de cido [employer] si [place]. Pida mtodos de control de la natalidad (anticonceptivos) si desea evitar un embarazo no deseado. Osteoporosis y [place] La osteoporosis es una enfermedad en la que los huesos pierden los  minerales y la fuerza por el avance de la edad. El resultado pueden ser fracturas en los [place]. Si tiene 65 aos o ms, o si est en riesgo de sufrir osteoporosis y fracturas, pregunte a su mdico si debe: Hacerse pruebas de deteccin de prdida sea. Tomar un suplemento de calcio o de vitamina D para reducir el riesgo de fracturas. Recibir terapia de reemplazo hormonal (TRH) para tratar los sntomas de la menopausia. Siga estas indicaciones en su casa: Consumo de alcohol No beba alcohol si: Su mdico le indica no hacerlo. Est embarazada, puede estar embarazada o est tratando de [place]. Si bebe alcohol: Limite la cantidad que bebe a lo siguiente: De 0 a 1 bebida por da. Sepa cunta cantidad de alcohol hay en las bebidas que toma. En los [address], una medida equivale a una botella de cerveza de 12 oz (355 ml), un vaso de vino de 5 oz (148 ml) o un vaso de una bebida alcohlica de alta graduacin de 1 oz (44 ml). Estilo de vida No consuma ningn producto que contenga nicotina o tabaco. Estos productos incluyen cigarrillos, tabaco para Theatre manager y aparatos de vapeo, como los Administrator, Civil Service. Si necesita ayuda para dejar de consumir estos productos, consulte al mdico. No consuma drogas. No comparta agujas. Solicite ayuda a su mdico si necesita apoyo o informacin para abandonar las drogas. Indicaciones generales Realcese los estudios de rutina de [address], dentales y de Wellsite geologist. Mantngase al [employer]  vacunas. Infrmele a su mdico si: Se siente deprimida con frecuencia. Alguna vez ha sido vctima de [place] o no se siente seguro en su casa. Resumen Adoptar un estilo de vida saludable y recibir atencin preventiva son importantes para promover la salud y Counsellor. Siga las instrucciones del mdico acerca de una dieta saludable, el ejercicio y la realizacin de pruebas o exmenes para Hotel manager. Siga las instrucciones del mdico con respecto  al control del colesterol y la presin arterial. Esta informacin no tiene Theme park manager el consejo del mdico. Asegrese de hacerle al mdico cualquier pregunta que tenga. Document Revised: 10/12/2020 Document Reviewed: 10/12/2020 Elsevier Patient Education  2024 [employer].

## 2023-01-23 NOTE — Assessment & Plan Note (Signed)
She just resumed Paroxetine 20 mg 2 weeks ago. No changes today. Psychiatry referral placed and a list of providers with contact information given.

## 2023-01-23 NOTE — Assessment & Plan Note (Signed)
Non pharmacologic treatment recommended for now. Further recommendations will be given according to 10 years CVD risk score and lipid panel numbers. 

## 2023-01-23 NOTE — Assessment & Plan Note (Signed)
She has not been compliant with taking medication, resume Lamictal 2 weeks ago. Insomnia can be related to this problem. Referral to psychiatrist placed. Instructed about warning signs.

## 2023-01-24 LAB — CYTOLOGY - PAP
Comment: NEGATIVE
Diagnosis: NEGATIVE
Diagnosis: REACTIVE
High risk HPV: NEGATIVE

## 2023-02-25 ENCOUNTER — Other Ambulatory Visit: Payer: Self-pay | Admitting: Family Medicine

## 2023-04-04 ENCOUNTER — Other Ambulatory Visit: Payer: Self-pay | Admitting: Family Medicine

## 2023-04-04 DIAGNOSIS — F331 Major depressive disorder, recurrent, moderate: Secondary | ICD-10-CM

## 2023-04-04 DIAGNOSIS — R232 Flushing: Secondary | ICD-10-CM

## 2023-05-09 ENCOUNTER — Ambulatory Visit: Payer: 59 | Admitting: Family Medicine

## 2023-07-29 ENCOUNTER — Other Ambulatory Visit: Payer: Self-pay | Admitting: Family Medicine

## 2023-07-29 DIAGNOSIS — F331 Major depressive disorder, recurrent, moderate: Secondary | ICD-10-CM

## 2023-07-29 DIAGNOSIS — R232 Flushing: Secondary | ICD-10-CM

## 2023-11-17 ENCOUNTER — Other Ambulatory Visit: Payer: Self-pay | Admitting: Family Medicine

## 2024-05-03 ENCOUNTER — Other Ambulatory Visit: Payer: Self-pay | Admitting: Family Medicine

## 2024-05-03 DIAGNOSIS — R232 Flushing: Secondary | ICD-10-CM

## 2024-05-03 DIAGNOSIS — F331 Major depressive disorder, recurrent, moderate: Secondary | ICD-10-CM

## 2024-05-17 ENCOUNTER — Other Ambulatory Visit: Payer: Self-pay | Admitting: Family Medicine
